# Patient Record
Sex: Male | Born: 1950 | Race: White | Hispanic: No | Smoking: Never smoker
Health system: Southern US, Community
[De-identification: ages and names within clinical notes are randomized; demographics above are authoritative.]

## PROBLEM LIST (undated history)

## (undated) DIAGNOSIS — E7800 Pure hypercholesterolemia, unspecified: Secondary | ICD-10-CM

## (undated) DIAGNOSIS — K921 Melena: Secondary | ICD-10-CM

## (undated) DIAGNOSIS — I10 Essential (primary) hypertension: Secondary | ICD-10-CM

## (undated) DIAGNOSIS — I639 Cerebral infarction, unspecified: Secondary | ICD-10-CM

## (undated) DIAGNOSIS — C61 Malignant neoplasm of prostate: Secondary | ICD-10-CM

## (undated) DIAGNOSIS — Z52008 Unspecified donor, other blood: Secondary | ICD-10-CM

## (undated) DIAGNOSIS — D179 Benign lipomatous neoplasm, unspecified: Secondary | ICD-10-CM

## (undated) HISTORY — DX: Malignant neoplasm of prostate: C61

## (undated) HISTORY — PX: CATARACT EXTRACTION: SUR2

## (undated) HISTORY — PX: OTHER SURGICAL HISTORY: SHX169

## (undated) HISTORY — PX: TONSILLECTOMY: SUR1361

## (undated) HISTORY — PX: HERNIA REPAIR: SHX51

## (undated) HISTORY — PX: CHOLECYSTECTOMY: SHX55

## (undated) HISTORY — DX: Melena: K92.1

## (undated) HISTORY — PX: PROSTATE SURGERY: SHX751

---

## 2000-07-17 ENCOUNTER — Other Ambulatory Visit: Admission: RE | Admit: 2000-07-17 | Discharge: 2000-07-17 | Payer: Self-pay | Admitting: Urology

## 2001-04-05 ENCOUNTER — Encounter: Payer: Self-pay | Admitting: Emergency Medicine

## 2001-04-05 ENCOUNTER — Emergency Department (HOSPITAL_COMMUNITY): Admission: EM | Admit: 2001-04-05 | Discharge: 2001-04-05 | Payer: Self-pay | Admitting: *Deleted

## 2002-10-05 ENCOUNTER — Ambulatory Visit (HOSPITAL_COMMUNITY): Admission: RE | Admit: 2002-10-05 | Discharge: 2002-10-05 | Payer: Self-pay | Admitting: Internal Medicine

## 2002-10-05 HISTORY — PX: COLONOSCOPY: SHX174

## 2003-12-14 ENCOUNTER — Other Ambulatory Visit: Admission: RE | Admit: 2003-12-14 | Discharge: 2003-12-14 | Payer: Self-pay | Admitting: Urology

## 2004-04-09 HISTORY — PX: ROBOT ASSISTED LAPAROSCOPIC RADICAL PROSTATECTOMY: SHX5141

## 2005-03-26 DIAGNOSIS — C61 Malignant neoplasm of prostate: Secondary | ICD-10-CM

## 2005-03-26 HISTORY — DX: Malignant neoplasm of prostate: C61

## 2005-08-09 ENCOUNTER — Ambulatory Visit: Payer: Self-pay | Admitting: Internal Medicine

## 2005-09-24 ENCOUNTER — Ambulatory Visit: Payer: Self-pay | Admitting: Internal Medicine

## 2005-09-24 ENCOUNTER — Ambulatory Visit (HOSPITAL_COMMUNITY): Admission: RE | Admit: 2005-09-24 | Discharge: 2005-09-24 | Payer: Self-pay | Admitting: Internal Medicine

## 2005-09-24 HISTORY — PX: COLONOSCOPY: SHX174

## 2007-11-07 ENCOUNTER — Ambulatory Visit (HOSPITAL_COMMUNITY): Admission: RE | Admit: 2007-11-07 | Discharge: 2007-11-07 | Payer: Self-pay | Admitting: Family Medicine

## 2008-05-10 ENCOUNTER — Ambulatory Visit (HOSPITAL_COMMUNITY): Admission: RE | Admit: 2008-05-10 | Discharge: 2008-05-10 | Payer: Self-pay | Admitting: Urology

## 2009-11-06 ENCOUNTER — Emergency Department (HOSPITAL_COMMUNITY): Admission: EM | Admit: 2009-11-06 | Discharge: 2009-11-06 | Payer: Self-pay | Admitting: Emergency Medicine

## 2009-11-07 ENCOUNTER — Observation Stay (HOSPITAL_COMMUNITY): Admission: EM | Admit: 2009-11-07 | Discharge: 2009-11-08 | Payer: Self-pay | Admitting: Emergency Medicine

## 2009-11-07 ENCOUNTER — Encounter: Payer: Self-pay | Admitting: Emergency Medicine

## 2009-11-08 ENCOUNTER — Encounter (INDEPENDENT_AMBULATORY_CARE_PROVIDER_SITE_OTHER): Payer: Self-pay | Admitting: General Surgery

## 2009-11-08 HISTORY — PX: CHOLECYSTECTOMY: SHX55

## 2010-06-23 LAB — DIFFERENTIAL
Basophils Absolute: 0 10*3/uL (ref 0.0–0.1)
Basophils Absolute: 0 10*3/uL (ref 0.0–0.1)
Basophils Relative: 0 % (ref 0–1)
Basophils Relative: 0 % (ref 0–1)
Eosinophils Relative: 0 % (ref 0–5)
Lymphocytes Relative: 6 % — ABNORMAL LOW (ref 12–46)
Lymphocytes Relative: 8 % — ABNORMAL LOW (ref 12–46)
Monocytes Absolute: 0.8 10*3/uL (ref 0.1–1.0)
Neutro Abs: 12.9 10*3/uL — ABNORMAL HIGH (ref 1.7–7.7)
Neutro Abs: 8.5 10*3/uL — ABNORMAL HIGH (ref 1.7–7.7)
Neutrophils Relative %: 83 % — ABNORMAL HIGH (ref 43–77)

## 2010-06-23 LAB — CBC
HCT: 36.1 % — ABNORMAL LOW (ref 39.0–52.0)
HCT: 42.4 % (ref 39.0–52.0)
Hemoglobin: 14.4 g/dL (ref 13.0–17.0)
MCH: 31 pg (ref 26.0–34.0)
MCHC: 34.5 g/dL (ref 30.0–36.0)
MCV: 91.3 fL (ref 78.0–100.0)
RBC: 4.65 MIL/uL (ref 4.22–5.81)
RDW: 12.6 % (ref 11.5–15.5)
WBC: 10.2 10*3/uL (ref 4.0–10.5)

## 2010-06-23 LAB — BASIC METABOLIC PANEL
BUN: 12 mg/dL (ref 6–23)
Calcium: 8.1 mg/dL — ABNORMAL LOW (ref 8.4–10.5)
GFR calc non Af Amer: >60 mL/min (ref >60)
Glucose, Bld: 93 mg/dL (ref 70–99)
Potassium: 3.8 mEq/L (ref 3.5–5.1)
Sodium: 136 mEq/L (ref 135–145)

## 2010-06-23 LAB — COMPREHENSIVE METABOLIC PANEL
Alkaline Phosphatase: 58 U/L (ref 39–117)
BUN: 13 mg/dL (ref 6–23)
CO2: 28 mEq/L (ref 19–32)
Chloride: 101 mEq/L (ref 96–112)
Creatinine, Ser: 0.96 mg/dL (ref 0.4–1.5)
GFR calc non Af Amer: >60 mL/min (ref >60)
Potassium: 3.7 mEq/L (ref 3.5–5.1)
Total Bilirubin: 1.3 mg/dL — ABNORMAL HIGH (ref 0.3–1.2)

## 2010-06-23 LAB — LIPASE, BLOOD: Lipase: 25 U/L (ref 11–59)

## 2010-06-24 LAB — DIFFERENTIAL
Basophils Absolute: 0 K/uL (ref 0.0–0.1)
Basophils Relative: 0 % (ref 0–1)
Eosinophils Absolute: 0.1 10*3/uL (ref 0.0–0.7)
Eosinophils Relative: 1 % (ref 0–5)
Lymphocytes Relative: 10 % — ABNORMAL LOW (ref 12–46)
Lymphs Abs: 1.4 10*3/uL (ref 0.7–4.0)
Monocytes Absolute: 0.5 K/uL (ref 0.1–1.0)
Monocytes Relative: 4 % (ref 3–12)
Neutro Abs: 12.2 K/uL — ABNORMAL HIGH (ref 1.7–7.7)
Neutrophils Relative %: 86 % — ABNORMAL HIGH (ref 43–77)

## 2010-06-24 LAB — URINALYSIS, ROUTINE W REFLEX MICROSCOPIC
Bilirubin Urine: NEGATIVE
Glucose, UA: NEGATIVE mg/dL
Hgb urine dipstick: NEGATIVE
Ketones, ur: 40 mg/dL — AB
Nitrite: NEGATIVE
Protein, ur: NEGATIVE mg/dL
Specific Gravity, Urine: 1.015 (ref 1.005–1.030)
Urobilinogen, UA: 0.2 mg/dL (ref 0.0–1.0)
pH: 8.5 — ABNORMAL HIGH (ref 5.0–8.0)

## 2010-06-24 LAB — COMPREHENSIVE METABOLIC PANEL
ALT: 17 U/L (ref 0–53)
AST: 20 U/L (ref 0–37)
Alkaline Phosphatase: 63 U/L (ref 39–117)
CO2: 25 mEq/L (ref 19–32)
Calcium: 9.4 mg/dL (ref 8.4–10.5)
Chloride: 103 mEq/L (ref 96–112)
GFR calc Af Amer: >60 mL/min (ref >60)
GFR calc non Af Amer: >60 mL/min (ref >60)
Glucose, Bld: 135 mg/dL — ABNORMAL HIGH (ref 70–99)
Potassium: 3.6 mEq/L (ref 3.5–5.1)
Sodium: 139 mEq/L (ref 135–145)

## 2010-06-24 LAB — CBC
HCT: 43.1 % (ref 39.0–52.0)
Hemoglobin: 14.9 g/dL (ref 13.0–17.0)
MCH: 31.1 pg (ref 26.0–34.0)
MCHC: 34.5 g/dL (ref 30.0–36.0)
MCV: 90.3 fL (ref 78.0–100.0)
Platelets: 268 K/uL (ref 150–400)
RBC: 4.78 MIL/uL (ref 4.22–5.81)
RDW: 12.7 % (ref 11.5–15.5)
WBC: 14.3 10*3/uL — ABNORMAL HIGH (ref 4.0–10.5)

## 2010-06-24 LAB — POCT CARDIAC MARKERS
CKMB, poc: <1 ng/mL — ABNORMAL LOW (ref 1.0–8.0)
Myoglobin, poc: 50 ng/mL (ref 12–200)
Troponin i, poc: <0.05 ng/mL (ref 0.00–0.09)

## 2010-06-24 LAB — COMPREHENSIVE METABOLIC PANEL WITH GFR
Albumin: 4.2 g/dL (ref 3.5–5.2)
BUN: 14 mg/dL (ref 6–23)
Creatinine, Ser: 1.12 mg/dL (ref 0.4–1.5)
Total Bilirubin: 0.6 mg/dL (ref 0.3–1.2)
Total Protein: 6.7 g/dL (ref 6.0–8.3)

## 2010-06-24 LAB — LIPASE, BLOOD: Lipase: 26 U/L (ref 11–59)

## 2010-08-25 NOTE — Op Note (Signed)
NAME:  [name], [name]                ACCOUNT NO.:  [id number]   MEDICAL RECORD NO.:  [id number]          PATIENT TYPE:  AMB   LOCATION:  DAY                           FACILITY:  [facility]   PHYSICIAN:  Lionel December, M.D.    DATE OF BIRTH:  January 03, 1951   DATE OF PROCEDURE:  09/24/2005  DATE OF DISCHARGE:                                 OPERATIVE REPORT   PROCEDURES:  Colonoscopy.   INDICATIONS:  [name] is 60 year old Caucasian male with intermittent  hematochezia which started 6 or 7 months ago.  It is suspected that he has  hemorrhoidal bleeding even though he did not respond to a topical therapy  given to him by Dr. Phillips Odor.  He has history of colonic polyps.  He had two  pedunculated adenomas removed by Dr. Jena Gauss 3 years ago and is due for  surveillance colonoscopy.  Procedure risks were reviewed the patient,  informed consent was obtained.   MEDS FOR CONSCIOUS SEDATION:  Demerol 50 mg IV, Versed 5 mg IV.   FINDINGS:  Procedure performed in endoscopy suite.  The patient's vital  signs and O2 sat were monitored during procedure and remained stable.  The  patient was placed left lateral position.  Rectal examination performed.  No  abnormality noted external or digital exam.  Olympus videoscope was placed  rectum and advanced under vision into sigmoid colon beyond.  Preparation was  excellent.  Few small scattered diverticula noted at sigmoid colon.  Scope  was passed into cecum which was identified by ileocecal valve and  appendiceal orifice.  Pictures taken for the record.  As the scope was  withdrawn colonic mucosa was examined for the second time and there were no  polyps or arteriovenous malformation or masses.  Rectal mucosa and was  normal.  Scope was retroflexed to examine anorectal junction and hemorrhoids  were noted above and below the dentate line.  Endoscope was straightened and  withdrawn.  The patient tolerated the procedure well.   FINAL DIAGNOSIS:  No evidence of  recurrent polyps or colitis.   Few scattered diverticula at sigmoid colon.   External slash internal hemorrhoids felt to be source of his recurrent  hematochezia.   RECOMMENDATIONS:  High-fiber diet.  Citrucel 3-4 grams daily.  Colace 2  tablets at bedtime.   He will keep the record as to frequency of bleeding episodes and call us if  it is occurring on regular basis.   He will return for next colonoscopy in 5 years from now.      Lionel December, M.D.  Electronically Signed     NR/MEDQ  D:  09/24/2005  T:  09/25/2005  Job:  [id number]   cc:   Corrie Mckusick, M.D.  Fax: [phone]

## 2010-08-25 NOTE — Consult Note (Signed)
NAME:  [name], [name]                            ACCOUNT NO.:  [id number]   MEDICAL RECORD NO.:  [id number]                  PATIENT TYPE:   LOCATION:                                       FACILITY:  [facility]   PHYSICIAN:  R. Roetta Sessions, M.D.              DATE OF BIRTH:  [birth year]   DATE OF CONSULTATION:  DATE OF DISCHARGE:                                   CONSULTATION   REFERRING PHYSICIAN:  Corrie Mckusick, M.D.   REASON FOR CONSULTATION:  Rectal bleeding.   HISTORY OF PRESENT ILLNESS:  [name] is a 60 year old Caucasian gentleman  who has had 2-3 episodes of intermittent hematochezia over the past 15  years.  However, a few weeks ago, he had rectal bleeding which occurred  every day for approximately a week; bright red in color and noted only the  toilet tissue.  He has had no pain, no rectal bleeding, abdominal pain.  Bowel movements usually every other day.  He denies any constipation,  nausea, vomiting, or diarrhea.  His weight has been stable.  He has no  history of hemorrhoids.  He had occasional reflux a few years ago.  He  required PPI therapy for approximately a month.  He has had no reflux since.  His appetite has been good.   CURRENT MEDICATIONS:  None.   ALLERGIES:  No known drug allergies.   PAST MEDICAL HISTORY:  Benign prostatic hypertrophy status post surgery.  Tonsillectomy.   FAMILY HISTORY:  Negative for chronic GI illnesses, colorectal cancer.   SOCIAL HISTORY:  Married for 12 years.  He has no children.  He is employed  with [employer] in [employer].  He is also a Visual merchandiser.  He denies any tobacco abuse.  He drinks alcohol approximately once monthly.   REVIEW OF SYSTEMS:  Please see HPI for GI and general.  CARDIOPULMONARY:  Denies any chest pain, shortness of breath, or palpitations.   PHYSICAL EXAMINATION:  VITAL SIGNS:  Weight 215, height 6 feet 2 inches,  temperature 97.2, blood pressure 118/70, pulse 70.  GENERAL:  A  pleasant, well-nourished, well-developed Caucasian male in no  acute distress.  SKIN:  Warm and dry, no jaundice.  HEENT:  Conjunctivae are pink, sclerae nonicteric.  Oropharyngeal mucosa is  moist and pink.  CHEST:  Lungs are clear to auscultation.  CARDIAC:  Reveals regular rate and rhythm, normal S1, S2.  No murmurs, rubs,  or gallops.  ABDOMEN:  Positive bowel sounds.  Soft, nontender, nondistended, no  organomegaly or masses.  EXTREMITIES:  No edema.  RECTAL:  Examination performed by Marijo Conception, nurse practitioner,  no evidence of external hemorrhoids.  Internal exam not performed.   IMPRESSION:  The patient is a 60 year old gentleman with intermittent small-  volume hematochezia.  Suspect that it is due to benign anorectal source.  He  has never had a colonoscopy and  I have recommended one today both for  diagnostic purpose as well as colorectal cancer screening.  I have discussed  risks, alternatives, and benefits with the patient and he is agreeable to  proceed.   PLAN:  Colonoscopy in the near future.  I would like to thank Dr. Phillips Jones  for allowing [place] to take part in the care of this patient.     Tana Coast, Pricilla Larsson, M.D.    LL/MEDQ  D:  09/17/2002  T:  09/17/2002  Job:  [id number]

## 2010-08-25 NOTE — Op Note (Signed)
NAME:  [name], [name]                          ACCOUNT NO.:  [id number]   MEDICAL RECORD NO.:  [id number]                   PATIENT TYPE:  AMB   LOCATION:  DAY                                  FACILITY:  [facility]   PHYSICIAN:  R. Roetta Sessions, M.D.              DATE OF BIRTH:  28-Feb-1951   DATE OF PROCEDURE:  10/05/2002  DATE OF DISCHARGE:                                 OPERATIVE REPORT   PROCEDURE:  Colonoscopy with snare polypectomy.   INDICATIONS FOR PROCEDURE:  The patient is a 61 year old gentleman with a  recent single episode of small volume hematochezia.  Colonoscopy is now  being done to further evaluate hematochezia.  This approach has been  discussed with the patient and potential risks, benefits, and alternatives  have been reviewed, questions answered.  He is agreeable.  Please see my  dictated consultation note for more information.   MONITORING:  O2 saturation, blood pressure, and pulses and respirations were  monitored throughout the entire procedure.   PREMEDICATION:  Conscious sedation Versed 4 mg IV, Demerol 75 mg IV in  divided doses.   INSTRUMENT:  Olympus video chip adult colonoscope.   FINDINGS:  Digital rectal examination revealed no abnormalities.   ENDOSCOPIC FINDINGS:  The prep was good.  The rectum: Examination of the  rectal mucosa including retroflexed view of the anal verge revealed only  some anal canal hemorrhoids.   Colon:  The colonic mucosa was surveyed from the rectosigmoid junction  through the left, transverse and right colon to the area of the appendiceal  orifice, ileocecal valve and cecum.  These structures were well seen and  photographed for the record.  The patient was noted to have scattered  sigmoid diverticula.  The patient had pedunculated polyps in the right  colon.  The cecum, ileocecal valve, appendiceal orifice were well seen and  photographed for the record.  The patient had a 1.25 cm pedunculated polyp  in the mid  ascending colon and two 0.75 cm polyps on stalks in the area of  the hepatic flexure.  These were snared and recovered through the scope  without difficulty.  From this level, the scope was slowly withdrawn.  All  previously mentioned mucosal surfaces were again seen and no other  abnormalities were observed.  The patient tolerated the procedure well, was  reactive to endoscopy.   IMPRESSION:  1. Anal canal hemorrhoids, otherwise normal rectum.  2. Scattered sigmoid diverticula.  3. Pedunculated polyps in the right colon/hepatic flexure resected as     described above.  The remainder of the colonic mucosa appeared normal.   RECOMMENDATIONS:  1. No aspirin, Advil or other aspirin-type medications for 10 days.  2.     Diverticulosis literature provided to [name].  3. Follow up on pathology.  4. Further recommendations to follow.  [name], M.D.    RMR/MEDQ  D:  10/05/2002  T:  10/05/2002  Job:  [id number]   cc:   Corrie Mckusick, M.D.  [address] [place]. [place]  [place]  [place] [address]  Fax: [phone]

## 2010-08-25 NOTE — Consult Note (Signed)
NAME:  [name]                ACCOUNT NO.:  [id number]   MEDICAL RECORD NO.:  [id number]           PATIENT TYPE:   LOCATION:                                 FACILITY:   PHYSICIAN:  R. Roetta Sessions, M.D. DATE OF BIRTH:  [birth year]   DATE OF CONSULTATION:  08/09/2005  DATE OF DISCHARGE:                                   CONSULTATION   REQUESTING PHYSICIAN:  Dr. Assunta Found   REASON FOR CONSULTATION:  Hematochezia.   HISTORY OF PRESENT ILLNESS:  [name] is a 60 year old Caucasian gentleman  patient of Dr. Assunta Found who presents for further evaluation of  hematochezia.  He has a history of adenomatous polyps removed in June 2004.  He had pedunculated polyps in the right colon and hepatic flexure.  He had  scattered sigmoid diverticula, anal canal hemorrhoids.  He is due for a  surveillance study next month but because of intermittent hematochezia for  the last six months or so he has been scheduled to be seen today.  He says  he was diagnosed with prostate cancer in August 2006.  Ever since the time  of his first prostate biopsy he has had intermittent bleeding per rectum.  This went on until he had his prostate surgery.  He did well for a couple  months, but back in February started having intermittent hematochezia again.  Sometimes he describes large volume fresh blood per rectum.  No clots.  Denies any rectal pain, abdominal pain.  Rarely has constipation.  No  heartburn as long as he takes his Aciphex.  No diarrhea or melena.   CURRENT MEDICATIONS:  1.  Aciphex 20 mg daily p.r.n.  2.  Detrol LA 4 mg daily.  3.  Boost one can daily.   ALLERGIES:  No known drug allergies.   PAST MEDICAL HISTORY:  1.  Prostate cancer as outlined above.  2.  Chronic gastroesophageal reflux disease.  3.  Tonsillectomy.   FAMILY HISTORY:  Father had prostate cancer.  Mother has dementia, heart  disease.  Father also has kidney disease.  No family history of colorectal  cancer.   SOCIAL HISTORY:  He is married.  No children.  He works at [employer].  Nonsmoker.  Rarely consumes alcohol.   REVIEW OF SYSTEMS:  See HPI for GI.  CONSTITUTIONAL:  No weight loss.  CARDIOPULMONARY:  No chest pain.  No shortness of breath.   PHYSICAL EXAMINATION:  VITAL SIGNS:  Weight 215, height 6 feet 2 inches,  temperature 98.1, blood pressure 122/80, pulse 74.  GENERAL:  Pleasant, well-nourished, well-developed Caucasian male in no  acute distress.  SKIN:  Warm and dry.  No jaundice.  HEENT:  Conjunctivae are pink.  Sclerae non-icteric.  Oropharyngeal mucosa  moist and pink.  No lesions, erythema, or exudate.  No lymphadenopathy,  thyromegaly.  CHEST:  Lungs are clear to auscultation.  CARDIAC:  Regular rate and rhythm.  Normal S1, S2.  No murmurs, rubs, or  gallops.  ABDOMEN:  Positive bowel sounds, soft, nondistended.  Small umbilical hernia  easily reducible, nontender.  Abdomen is  soft.  No organomegaly or masses.  No rebound tenderness or guarding.  EXTREMITIES:  No edema.  RECTAL:  Deferred until time of colonoscopy.   IMPRESSION:  [name] is a 60 year old gentleman with history of  adenomatous polyps, last colonoscopy June 2004, who presents with  intermittent hematochezia for the last six to eight months' duration.  Previously patient had fairly large polyps measuring 1.25 cm and 0.75 cm on  stalks.  He is due for a surveillance examination anyway and because of  persistent symptoms will go ahead and proceed now.  I have discussed risks,  alternatives, and benefits with patient.  He is agreeable to proceed.   PLAN:  Colonoscopy in the near future.  Further recommendations to follow.      Tana Coast, P.AJonathon Bellows, M.D.  Electronically Signed    LL/MEDQ  D:  08/09/2005  T:  08/10/2005  Job:  [id number]

## 2010-11-17 ENCOUNTER — Telehealth (INDEPENDENT_AMBULATORY_CARE_PROVIDER_SITE_OTHER): Payer: Self-pay | Admitting: *Deleted

## 2010-11-17 NOTE — Telephone Encounter (Signed)
TCS resch'd to 01/24/11 @ 12:15 (11:15), asa 2 days, movi prep instructions mailed

## 2010-11-22 ENCOUNTER — Encounter (INDEPENDENT_AMBULATORY_CARE_PROVIDER_SITE_OTHER): Payer: Self-pay

## 2010-12-04 ENCOUNTER — Encounter (INDEPENDENT_AMBULATORY_CARE_PROVIDER_SITE_OTHER): Payer: Self-pay | Admitting: Internal Medicine

## 2010-12-04 ENCOUNTER — Telehealth (INDEPENDENT_AMBULATORY_CARE_PROVIDER_SITE_OTHER): Payer: Self-pay | Admitting: *Deleted

## 2010-12-04 ENCOUNTER — Ambulatory Visit (INDEPENDENT_AMBULATORY_CARE_PROVIDER_SITE_OTHER): Payer: BC Managed Care – PPO | Admitting: Internal Medicine

## 2010-12-04 VITALS — BP 120/80 | HR 74 | Temp 98.3°F | Ht 74.0 in | Wt 220.0 lb

## 2010-12-04 DIAGNOSIS — Z860100 Personal history of colon polyps, unspecified: Secondary | ICD-10-CM

## 2010-12-04 DIAGNOSIS — K921 Melena: Secondary | ICD-10-CM

## 2010-12-04 DIAGNOSIS — K649 Unspecified hemorrhoids: Secondary | ICD-10-CM

## 2010-12-04 MED ORDER — HYDROCORTISONE ACETATE 25 MG RE SUPP: 25.0000 mg | Freq: Every day | RECTAL | Status: AC

## 2010-12-04 NOTE — Telephone Encounter (Signed)
TCS sch'd 12/08/10 @ 1:05 (12:05), split movi prep instructions, sample given

## 2010-12-04 NOTE — Patient Instructions (Signed)
anusol-HC supp one per rectum daily at bedtime for 12 days. Increase intake of fiber rich foods. Colace(stool softener0 2 tablets daily. Colonoscopy to be scheduled

## 2010-12-05 NOTE — Consult Note (Signed)
NAME:  Nicolas, Jones                ACCOUNT NO.:  [id number]  MEDICAL RECORD NO.:  [id number]  LOCATION:                                 FACILITY:  PHYSICIAN:  Lionel December, M.D.    DATE OF BIRTH:  07-29-1950  DATE OF CONSULTATION: DATE OF DISCHARGE:                                CONSULTATION   PRESENTING COMPLAINT:  Rectal bleeding.  HISTORY OF PRESENT ILLNESS:  [name] is 60 year old Caucasian male who was referred through courtesy of Dr. Sherwood Gambler, for GI evaluation.  He has history of colonic polyps as reviewed under past medical history whose last exam was in June 2007, who has been experiencing hematochezia for the last 6-7 months.  His bowels move every other day and almost always he sees bright red blood with his bowel movements.  It is usually small in amount.  He denies anorectal or abdominal pain.  He also denies constipation or diarrhea.  He has good appetite and has not lost any weight.  He states his work is not necessarily strenuous or heavy physical work.  He denies nausea and vomiting.  He has occasional heartburn with spicy foods.  He does try to walk or exercise some.  His last physical exam was about a year ago where he had blood work through this employed and this was all normal.  CURRENT MEDICATIONS: 1. Fish oil. 2. Omega-3 fatty acid 1 g one daily. 3. Ibuprofen 200 mg one q.6 p.r.n. which he does not take very often.  PAST MEDICAL HISTORY:  He had tonsillectomy as a child.  He had robotic radical prostatectomy for prostate CA in December 2006 at [facility] and remains in remission.  He had laparoscopic cholecystectomy in August of 2011.  He has history of colonic polyps.  His first exam was in June 2004, by Dr. Jena Gauss.  He had sigmoid diverticulosis, external hemorrhoids, and he had two pedunculated adenomas removed from his right colon.  His subsequent exam was in June 2007, by me and he had sigmoid diverticula and external/internal  hemorrhoids.  ALLERGIES:  NK.  FAMILY HISTORY:  Father lived to be 30 and mother died at age 92.  He has two sisters who are in fair to good health.  SOCIAL HISTORY:  He is married, but does not have any children.  He has never smoked cigarettes and he drinks alcohol occasionally.  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  PHYSICAL EXAMINATION:  VITAL SIGNS:  Weight 220 pounds, he is 74 inches tall, pulse 74 per minute, blood pressure 120/80, respirations 18, and temp is 98.3. HEENT:  Conjunctivae is pink.  Sclerae are nonicteric.  Oropharyngeal mucosa is normal.  Dentition in satisfactory condition.  No neck masses or thyromegaly noted.  Carotids are without bruits. CARDIAC:  With regular rhythm.  Normal S1 and S2.  No murmur or gallop noted. LUNGS:  Clear to auscultation. ABDOMEN:  Symmetrical.  He has normal bowel sounds.  He has small umbilical hernia which is completely reducible.  He has multiple subcutaneous nodules both on the left mid and upper as well as on  the right side consistent with lipomas.  No tenderness, organomegaly, or masses noted. RECTAL:  Limited to external inspection and no abnormality noted. EXTREMITIES:  No peripheral edema or clubbing noted.  ASSESSMENT:  [name] is 60 year old Caucasian male Caucasian male with chronic hematochezia without diarrhea and/or constipation.  I suspect he has hemorrhoidal bleeding.  He has a history of colonic adenomas.  He had two removed in 2004, but his last colonoscopy in June 2007, did not reveal any recurrent polyps.  He is due for colonoscopy.  RECOMMENDATIONS: 1. Diagnostic/surveillance colonoscopy is scheduled in near future. 2. In the meantime, I would like him to increase intake of fiber rich     foods. 3. Colace 2 tablets daily at bedtime. 4. Anusol-HC suppository one per rectum at bedtime for 12 days. 5. Further recommendation will depend on colonoscopic  findings.  We appreciate the opportunity to participate in the care of this gentleman.          ______________________________ Lionel December, M.D.     NR/MEDQ  D:  12/04/2010  T:  12/05/2010  Job:  [id number]  cc:   Madelin Rear. Sherwood Gambler, MD Fax: [phone]

## 2010-12-07 NOTE — Progress Notes (Signed)
PRESENTING COMPLAINT: Rectal bleeding.  HISTORY OF PRESENT ILLNESS: [name] is 61 year old Caucasian male who was  referred through courtesy of Dr. Sherwood Gambler, for GI evaluation. He has  history of colonic polyps as reviewed under past medical history whose  last exam was in June 2007, who has been experiencing hematochezia for  the last 6-7 months. His bowels move every other day and almost always  he sees bright red blood with his bowel movements. It is usually small  in amount. He denies anorectal or abdominal pain. He also denies  constipation or diarrhea. He has good appetite and has not lost any  weight. He states his work is not necessarily strenuous or heavy  physical work. He denies nausea and vomiting. He has occasional  heartburn with spicy foods. He does try to walk or exercise some. His  last physical exam was about a year ago where he had blood work through  this employed and this was all normal.  CURRENT MEDICATIONS:  1. Fish oil.  2. Omega-3 fatty acid 1 g one daily.  3. Ibuprofen 200 mg one q.6 p.r.n. which he does not take very often.  PAST MEDICAL HISTORY: He had tonsillectomy as a child. He had robotic  radical prostatectomy for prostate CA in December 2006 at [facility] and remains in remission. He had laparoscopic cholecystectomy in  August of 2011.  He has history of colonic polyps. His first exam was in June 2004, by  Dr. Jena Gauss. He had sigmoid diverticulosis, external hemorrhoids, and he  had two pedunculated adenomas removed from his right colon. His  subsequent exam was in June 2007, by me and he had sigmoid diverticula  and external/internal hemorrhoids.  ALLERGIES: NK.  FAMILY HISTORY: Father lived to be 63 and mother died at age 20. He  has two sisters who are in fair to good health.  SOCIAL HISTORY: He is married, but does not have any children. He has  never smoked cigarettes and he drinks alcohol occasionally. 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  PHYSICAL EXAMINATION: VITAL SIGNS: Weight 220 pounds, he is 74 inches  tall, pulse 74 per minute, blood pressure 120/80, respirations 18, and  temp is 98.3.  HEENT: Conjunctivae is pink. Sclerae are nonicteric. Oropharyngeal  mucosa is normal. Dentition in satisfactory condition. No neck masses  or thyromegaly noted. Carotids are without bruits.  CARDIAC: With regular rhythm. Normal S1 and S2. No murmur or gallop  noted.  LUNGS: Clear to auscultation.  ABDOMEN: Symmetrical. He has normal bowel sounds. He has small  umbilical hernia which is completely reducible. He has multiple  subcutaneous nodules both on the left mid and upper as well as on the  right side consistent with lipomas. No tenderness, organomegaly, or  masses noted.  RECTAL: Limited to external inspection and no abnormality noted.  EXTREMITIES: No peripheral edema or clubbing noted.  ASSESSMENT: [name] is 60 year old Caucasian male with chronic  hematochezia without diarrhea and/or constipation. I suspect he has  hemorrhoidal bleeding. He has a history of colonic adenomas. He had  two removed in 2004, but his last colonoscopy in June 2007, did not  reveal any polyps. He is due for colonoscopy.  RECOMMENDATIONS:  1. Diagnostic/surveillance colonoscopy is scheduled in near future.  2. In the meantime, I would like him to increase intake of fiber rich  foods.  3. Colace 2 tablets daily at bedtime.  4. Anusol-HC suppository one per rectum at bedtime for 12 days.  5. Further recommendation will depend on colonoscopic findings.  We appreciate the opportunity to participate in the care of this  gentleman.

## 2010-12-08 ENCOUNTER — Encounter (HOSPITAL_COMMUNITY)
Admission: RE | Disposition: A | Discharge status: Home / Self Care | Payer: Self-pay | Source: Ambulatory Visit | Attending: Internal Medicine

## 2010-12-08 ENCOUNTER — Other Ambulatory Visit (INDEPENDENT_AMBULATORY_CARE_PROVIDER_SITE_OTHER): Payer: Self-pay | Admitting: Internal Medicine

## 2010-12-08 ENCOUNTER — Ambulatory Visit (HOSPITAL_COMMUNITY)
Admission: RE | Admit: 2010-12-08 | Discharge: 2010-12-08 | Disposition: A | Discharge status: Home / Self Care | Payer: BC Managed Care – PPO | Source: Ambulatory Visit | Attending: Internal Medicine | Admitting: Internal Medicine

## 2010-12-08 DIAGNOSIS — K5732 Diverticulitis of large intestine without perforation or abscess without bleeding: Secondary | ICD-10-CM | POA: Insufficient documentation

## 2010-12-08 DIAGNOSIS — K921 Melena: Secondary | ICD-10-CM | POA: Insufficient documentation

## 2010-12-08 DIAGNOSIS — Z860100 Personal history of colon polyps, unspecified: Secondary | ICD-10-CM | POA: Insufficient documentation

## 2010-12-08 DIAGNOSIS — D126 Benign neoplasm of colon, unspecified: Secondary | ICD-10-CM

## 2010-12-08 DIAGNOSIS — K5730 Diverticulosis of large intestine without perforation or abscess without bleeding: Secondary | ICD-10-CM | POA: Insufficient documentation

## 2010-12-08 DIAGNOSIS — K644 Residual hemorrhoidal skin tags: Secondary | ICD-10-CM | POA: Insufficient documentation

## 2010-12-08 HISTORY — PX: COLONOSCOPY: SHX5424

## 2010-12-08 SURGERY — COLONOSCOPY
Anesthesia: Moderate Sedation

## 2010-12-08 MED ORDER — MEPERIDINE HCL 50 MG/ML IJ SOLN
INTRAMUSCULAR | Status: AC
  Filled 2010-12-08: qty 1

## 2010-12-08 MED ORDER — METRONIDAZOLE 500 MG PO TABS: 500.0000 mg | ORAL_TABLET | Freq: Three times a day (TID) | ORAL | Status: AC

## 2010-12-08 MED ORDER — MIDAZOLAM HCL 5 MG/5ML IJ SOLN
INTRAMUSCULAR | Status: AC
  Filled 2010-12-08: qty 10

## 2010-12-08 MED ORDER — MEPERIDINE HCL 50 MG/ML IJ SOLN
INTRAMUSCULAR | Status: DC | PRN
  Administered 2010-12-08 (×2): 25 mg via INTRAVENOUS

## 2010-12-08 MED ORDER — SODIUM CHLORIDE 0.9 % IV SOLN
INTRAVENOUS | Status: DC
  Administered 2010-12-08: 12:00:00 via INTRAVENOUS

## 2010-12-08 MED ORDER — MIDAZOLAM HCL 5 MG/5ML IJ SOLN
INTRAMUSCULAR | Status: DC | PRN
  Administered 2010-12-08: 2 mg via INTRAVENOUS
  Administered 2010-12-08: 1 mg via INTRAVENOUS
  Administered 2010-12-08: 2 mg via INTRAVENOUS

## 2010-12-08 MED ORDER — CIPROFLOXACIN HCL 500 MG PO TABS: 500.0000 mg | ORAL_TABLET | Freq: Two times a day (BID) | ORAL | Status: AC

## 2010-12-08 NOTE — H&P (Signed)
No changes since patient seen in the office on 12/04/2010.

## 2010-12-08 NOTE — Op Note (Signed)
COLONOSCOPY REPORT  Endoscopist: Dr. Malissa Hippo, MD  Referred By: Dr. Corrie Mckusick, MD  Procedure Date: 12/08/2010  Procedure: Colonoscopy  Indications: History of colonic polyps. Recurrent hematochezia.  Informed Consent: Procedure and risks were reviewed with the patient and informed consent was obtained.  Medications:  Demerol 50 mg IV  Versed 5 mg IV  Description of procedure: After a digital rectal exam was performed, that colonoscope was advanced from the anus through the rectum and colon to the area of the cecum, ileocecal valve and appendiceal orifice. The cecum was deeply intubated. These structures were well-seen and photographed for the record. From the level of the cecum and ileocecal valve, the scope was slowly and cautiously withdrawn. The mucosal surfaces were carefully surveyed utilizing scope tip to flexion to facilitate fold flattening as needed. The scope was pulled down into the rectum where a thorough exam including retroflexion was performed.  Findings:  Prep excellent.  6 mm polyp snared from transverse colon.  moderate number of diverticula at sigmoid colon and few more at descending.  Single diverticulum at sigmoid colon with edema erythema.  3 mm polyp at distal sigmoid colon was coagulated using snare tip.  Prominent external hemorrhoids  Therapeutic/Diagnostic Maneuvers Performed: See above  Complications: None  Cecal Withdrawal Time: 19 minutes  Impression:  Examination performed to cecum.  Left sided diverticulosis with sigmoid diverticulitis  6 mm polyp snared from transverse colon and 3 mm polyp at sigmoid colon was coagulated using snare tip.  External hemorrhoids felt to be source of patient's recurrent hematochezia.  Recommendations:  No aspirin for one week  Cipro 500 mg twice daily by mouth for 10 days  metronidazole 500 mg twice daily by mouth for 10 days.  High fiber diet.  Physician will contact you with biopsy  results.  REHMAN,NAJEEB U 12/08/2010 1:18 PM   CC: Dr. Colette Ribas, MD

## 2010-12-20 ENCOUNTER — Encounter (INDEPENDENT_AMBULATORY_CARE_PROVIDER_SITE_OTHER): Payer: Self-pay | Admitting: Internal Medicine

## 2010-12-20 ENCOUNTER — Encounter (HOSPITAL_COMMUNITY): Payer: Self-pay | Admitting: Internal Medicine

## 2010-12-29 ENCOUNTER — Encounter (INDEPENDENT_AMBULATORY_CARE_PROVIDER_SITE_OTHER): Payer: Self-pay | Admitting: *Deleted

## 2011-01-24 ENCOUNTER — Ambulatory Visit (HOSPITAL_COMMUNITY): Admission: RE | Admit: 2011-01-24 | Payer: Self-pay | Source: Ambulatory Visit | Admitting: Internal Medicine

## 2011-01-24 ENCOUNTER — Encounter (HOSPITAL_COMMUNITY): Admission: RE | Payer: Self-pay | Source: Ambulatory Visit

## 2011-01-24 SURGERY — COLONOSCOPY
Anesthesia: Moderate Sedation

## 2014-09-10 ENCOUNTER — Ambulatory Visit (INDEPENDENT_AMBULATORY_CARE_PROVIDER_SITE_OTHER): Payer: BC Managed Care – PPO | Admitting: Urology

## 2014-09-10 DIAGNOSIS — N393 Stress incontinence (female) (male): Secondary | ICD-10-CM | POA: Diagnosis not present

## 2014-09-10 DIAGNOSIS — N5231 Erectile dysfunction following radical prostatectomy: Secondary | ICD-10-CM | POA: Diagnosis not present

## 2014-09-10 DIAGNOSIS — Z8546 Personal history of malignant neoplasm of prostate: Secondary | ICD-10-CM | POA: Diagnosis not present

## 2015-04-29 ENCOUNTER — Ambulatory Visit: Payer: Self-pay | Admitting: General Surgery

## 2015-04-29 NOTE — H&P (Addendum)
History of Present Illness Ralene Ok MD; 04/29/2015 10:32 AM) Patient words: NP Hernia.  The patient is a 65 year old male who presents with an inguinal hernia. The patient is a 65 year old male who is referred by Dr. Redmond School for evaluation of a right inguinal hernia. Patient states he's noticed a hernia since the middle December. He states this causes him some pain. The patient is fairly active is currently in the process of building a sunroom. Patient states the hernia is reducible. It bothers him intermittently.   Of note the patient has had a previous robotic prostatectomy with a lower midline incision.  Other Problems Yehuda Mao, RMA; 04/29/2015 10:10 AM) Hypercholesterolemia Other disease, cancer, significant illness Prostate Cancer  Past Surgical History Yehuda Mao, RMA; 04/29/2015 10:10 AM) Cataract Surgery Left. Colon Polyp Removal - Colonoscopy Gallbladder Surgery - Laparoscopic Prostate Surgery - Removal Tonsillectomy  Allergies Shirlean Mylar Gwynn, RMA; 04/29/2015 10:11 AM) No Known Drug Allergies01/20/2017  Medication History Shirlean Mylar Gwynn, RMA; 04/29/2015 10:11 AM) Fish Oil (1360MG  Capsule, Oral) Active. Ibuprofen (200MG  Capsule, Oral) Active. Medications Reconciled  Social History Yehuda Mao, RMA; 04/29/2015 10:10 AM) Alcohol use Occasional alcohol use. Caffeine use Carbonated beverages, Coffee. No drug use Tobacco use Never smoker.  Family History Yehuda Mao, RMA; 04/29/2015 10:10 AM) Diabetes Mellitus Sister. Kidney Disease Father.    Review of Systems Shirlean Mylar Gwynn RMA; 04/29/2015 10:10 AM) General Not Present- Appetite Loss, Chills, Fatigue, Fever, Night Sweats, Weight Gain and Weight Loss. Skin Not Present- Change in Wart/Mole, Dryness, Hives, Jaundice, New Lesions, Non-Healing Wounds, Rash and Ulcer. HEENT Present- Wears glasses/contact lenses. Not Present- Earache, Hearing Loss, Hoarseness, Nose Bleed, Oral Ulcers, Ringing  in the Ears, Seasonal Allergies, Sinus Pain, Sore Throat, Visual Disturbances and Yellow Eyes. Respiratory Not Present- Bloody sputum, Chronic Cough, Difficulty Breathing, Snoring and Wheezing. Breast Not Present- Breast Mass, Breast Pain, Nipple Discharge and Skin Changes. Cardiovascular Not Present- Chest Pain, Difficulty Breathing Lying Down, Leg Cramps, Palpitations, Rapid Heart Rate, Shortness of Breath and Swelling of Extremities. Gastrointestinal Not Present- Abdominal Pain, Bloating, Bloody Stool, Change in Bowel Habits, Chronic diarrhea, Constipation, Difficulty Swallowing, Excessive gas, Gets full quickly at meals, Hemorrhoids, Indigestion, Nausea, Rectal Pain and Vomiting. Male Genitourinary Present- Urine Leakage. Not Present- Blood in Urine, Change in Urinary Stream, Frequency, Impotence, Nocturia, Painful Urination and Urgency. Musculoskeletal Not Present- Back Pain, Joint Pain, Joint Stiffness, Muscle Pain, Muscle Weakness and Swelling of Extremities. Neurological Not Present- Decreased Memory, Fainting, Headaches, Numbness, Seizures, Tingling, Tremor, Trouble walking and Weakness. Psychiatric Not Present- Anxiety, Bipolar, Change in Sleep Pattern, Depression, Fearful and Frequent crying. Endocrine Not Present- Cold Intolerance, Excessive Hunger, Hair Changes, Heat Intolerance, Hot flashes and New Diabetes. Hematology Not Present- Easy Bruising, Excessive bleeding, Gland problems, HIV and Persistent Infections.  Vitals (Robin Gwynn RMA; 04/29/2015 10:12 AM) 04/29/2015 10:11 AM Weight: 207 lb Height: 74in Body Surface Area: 2.21 m Body Mass Index: 26.58 kg/m  Temp.: 97.65F  Pulse: 74 (Regular)  BP: 128/80 (Sitting, Left Arm, Standard)       Physical Exam Ralene Ok MD; 04/29/2015 10:33 AM) General Mental Status-Alert. General Appearance-Consistent with stated age. Hydration-Well hydrated. Voice-Normal.  Head and Neck Head-normocephalic,  atraumatic with no lesions or palpable masses. Trachea-midline.  Eye Eyeball - Bilateral-Extraocular movements intact. Sclera/Conjunctiva - Bilateral-No scleral icterus.  Chest and Lung Exam Chest and lung exam reveals -quiet, even and easy respiratory effort with no use of accessory muscles. Inspection Chest Wall - Normal. Back - normal.  Cardiovascular Cardiovascular examination reveals -normal  heart sounds, regular rate and rhythm with no murmurs.  Abdomen Inspection Skin - Scar - no surgical scars. Hernias - Inguinal hernia - Right - Reducible(Likely likely direct). Palpation/Percussion Normal exam - Soft, Non Tender, No Rebound tenderness, No Rigidity (guarding) and No hepatosplenomegaly. Auscultation Normal exam - Bowel sounds normal.  Neurologic Neurologic evaluation reveals -alert and oriented x 3 with no impairment of recent or remote memory. Mental Status-Normal.  Musculoskeletal Normal Exam - Left-Upper Extremity Strength Normal and Lower Extremity Strength Normal. Normal Exam - Right-Upper Extremity Strength Normal, Lower Extremity Weakness.    Assessment & Plan Ralene Ok MD; 04/29/2015 10:33 AM) RIGHT INGUINAL HERNIA (K40.90) Impression: 64 year old male with a likely right direct inguinal hernia.  1. The patient will like to proceed to the operating room for a laparoscopic( TAPP vs. TEP) versus possible open right inguinal hernia repair with mesh 2. All risks and benefits were discussed with the patient to generally include, but not limited to: infection, bleeding, damage to surrounding structures, acute and chronic nerve pain, and recurrence. Alternatives were offered and described. All questions were answered and the patient voiced understanding of the procedure and wishes to proceed at this point with hernia repair.

## 2015-05-19 ENCOUNTER — Encounter (HOSPITAL_COMMUNITY): Payer: Self-pay

## 2015-05-19 ENCOUNTER — Encounter (HOSPITAL_COMMUNITY)
Admission: RE | Admit: 2015-05-19 | Discharge: 2015-05-19 | Disposition: A | Discharge status: Home / Self Care | Payer: BC Managed Care – PPO | Source: Ambulatory Visit | Attending: General Surgery | Admitting: General Surgery

## 2015-05-19 DIAGNOSIS — Z01812 Encounter for preprocedural laboratory examination: Secondary | ICD-10-CM | POA: Insufficient documentation

## 2015-05-19 DIAGNOSIS — K4090 Unilateral inguinal hernia, without obstruction or gangrene, not specified as recurrent: Secondary | ICD-10-CM | POA: Insufficient documentation

## 2015-05-19 HISTORY — DX: Benign lipomatous neoplasm, unspecified: D17.9

## 2015-05-19 HISTORY — DX: Unspecified donor, other blood: Z52.008

## 2015-05-19 LAB — CBC
HCT: 46.4 % (ref 39.0–52.0)
Hemoglobin: 15.2 g/dL (ref 13.0–17.0)
MCH: 30.2 pg (ref 26.0–34.0)
MCHC: 32.8 g/dL (ref 30.0–36.0)
MCV: 92.1 fL (ref 78.0–100.0)
PLATELETS: 213 10*3/uL (ref 150–400)
RBC: 5.04 MIL/uL (ref 4.22–5.81)
RDW: 12.5 % (ref 11.5–15.5)
WBC: 8.9 10*3/uL (ref 4.0–10.5)

## 2015-05-19 NOTE — Patient Instructions (Addendum)
[name]  05/19/2015   Your procedure is scheduled on: 05-25-15  Report to [facility] Main  Entrance take [facility]  elevators to 3rd floor to  [place] at 0830 AM.  Call this number if you have problems the morning of surgery [phone]   Remember: ONLY 1 PERSON MAY GO WITH YOU TO SHORT STAY TO GET  READY MORNING OF [name].  Do not eat food or drink liquids :After Midnight.     Take these medicines the morning of surgery with A SIP OF WATER: NONE. DO NOT TAKE ANY DIABETIC MEDICATIONS DAY OF YOUR SURGERY                               You may not have any metal on your body including hair pins and              piercings  Do not wear jewelry, make-up, lotions, powders or perfumes, deodorant             Do not wear nail polish.  Do not shave  48 hours prior to surgery.              Men may shave face and neck.   Do not bring valuables to the hospital. [name].  Contacts, dentures or bridgework may not be worn into surgery.  Leave suitcase in the car. After surgery it may be brought to your room.     Patients discharged the day of surgery will not be allowed to drive home.  Name and phone number of your driver: spouse [name] [phone] home  Special Instructions: N/A              Please read over the following fact sheets you were given: _____________________________________________________________________             [facility] - Preparing for Surgery Before surgery, you can play an important role.  Because skin is not sterile, your skin needs to be as free of germs as possible.  You can reduce the number of germs on your skin by washing with CHG (chlorahexidine gluconate) soap before surgery.  CHG is an antiseptic cleaner which kills germs and bonds with the skin to continue killing germs even after washing. Please DO NOT use if you have an allergy to CHG or antibacterial soaps.  If your  skin becomes reddened/irritated stop using the CHG and inform your nurse when you arrive at Short Stay. Do not shave (including legs and underarms) for at least 48 hours prior to the first CHG shower.  You may shave your face/neck. Please follow these instructions carefully:  1.  Shower with CHG Soap the night before surgery and the  morning of Surgery.  2.  If you choose to wash your hair, wash your hair first as usual with your  normal  shampoo.  3.  After you shampoo, rinse your hair and body thoroughly to remove the  shampoo.                           4.  Use CHG as you would any other liquid soap.  You can apply chg directly  to the skin  and wash                       Gently with a scrungie or clean washcloth.  5.  Apply the CHG Soap to your body ONLY FROM THE NECK DOWN.   Do not use on face/ open                           Wound or open sores. Avoid contact with eyes, ears mouth and genitals (private parts).                       Wash face,  Genitals (private parts) with your normal soap.             6.  Wash thoroughly, paying special attention to the area where your surgery  will be performed.  7.  Thoroughly rinse your body with warm water from the neck down.  8.  DO NOT shower/wash with your normal soap after using and rinsing off  the CHG Soap.                9.  Pat yourself dry with a clean towel.            10.  Wear clean pajamas.            11.  Place clean sheets on your bed the night of your first shower and do not  sleep with pets. Day of Surgery : Do not apply any lotions/deodorants the morning of surgery.  Please wear clean clothes to the hospital/surgery center.  FAILURE TO FOLLOW THESE INSTRUCTIONS MAY RESULT IN THE CANCELLATION OF YOUR SURGERY PATIENT SIGNATURE_________________________________  NURSE SIGNATURE__________________________________  ________________________________________________________________________

## 2015-05-25 ENCOUNTER — Ambulatory Visit (HOSPITAL_COMMUNITY)
Admission: RE | Admit: 2015-05-25 | Discharge: 2015-05-25 | Disposition: A | Discharge status: Home / Self Care | Payer: BC Managed Care – PPO | Source: Ambulatory Visit | Attending: General Surgery | Admitting: General Surgery

## 2015-05-25 ENCOUNTER — Encounter (HOSPITAL_COMMUNITY)
Admission: RE | Disposition: A | Discharge status: Home / Self Care | Payer: Self-pay | Source: Ambulatory Visit | Attending: General Surgery

## 2015-05-25 ENCOUNTER — Encounter (HOSPITAL_COMMUNITY): Payer: Self-pay | Admitting: *Deleted

## 2015-05-25 ENCOUNTER — Ambulatory Visit (HOSPITAL_COMMUNITY): Payer: BC Managed Care – PPO | Admitting: Certified Registered Nurse Anesthetist

## 2015-05-25 DIAGNOSIS — Z8546 Personal history of malignant neoplasm of prostate: Secondary | ICD-10-CM | POA: Diagnosis not present

## 2015-05-25 DIAGNOSIS — Z9079 Acquired absence of other genital organ(s): Secondary | ICD-10-CM | POA: Diagnosis not present

## 2015-05-25 DIAGNOSIS — E7800 Pure hypercholesterolemia, unspecified: Secondary | ICD-10-CM | POA: Diagnosis not present

## 2015-05-25 DIAGNOSIS — K4090 Unilateral inguinal hernia, without obstruction or gangrene, not specified as recurrent: Secondary | ICD-10-CM | POA: Insufficient documentation

## 2015-05-25 DIAGNOSIS — Z79899 Other long term (current) drug therapy: Secondary | ICD-10-CM | POA: Diagnosis not present

## 2015-05-25 HISTORY — PX: INSERTION OF MESH: SHX5868

## 2015-05-25 HISTORY — PX: INGUINAL HERNIA REPAIR: SHX194

## 2015-05-25 SURGERY — REPAIR, HERNIA, INGUINAL, LAPAROSCOPIC
Anesthesia: General | Laterality: Right

## 2015-05-25 MED ORDER — CHLORHEXIDINE GLUCONATE 4 % EX LIQD: 1.0000 "application " | Freq: Once | CUTANEOUS | Status: DC

## 2015-05-25 MED ORDER — BUPIVACAINE-EPINEPHRINE (PF) 0.25% -1:200000 IJ SOLN
INTRAMUSCULAR | Status: AC
  Filled 2015-05-25: qty 30

## 2015-05-25 MED ORDER — SUGAMMADEX SODIUM 200 MG/2ML IV SOLN
INTRAVENOUS | Status: AC
  Filled 2015-05-25: qty 2

## 2015-05-25 MED ORDER — ONDANSETRON HCL 4 MG/2ML IJ SOLN
INTRAMUSCULAR | Status: DC | PRN
  Administered 2015-05-25: 4 mg via INTRAVENOUS

## 2015-05-25 MED ORDER — PHENYLEPHRINE 40 MCG/ML (10ML) SYRINGE FOR IV PUSH (FOR BLOOD PRESSURE SUPPORT)
PREFILLED_SYRINGE | INTRAVENOUS | Status: AC
  Filled 2015-05-25: qty 10

## 2015-05-25 MED ORDER — SUGAMMADEX SODIUM 200 MG/2ML IV SOLN
INTRAVENOUS | Status: DC | PRN
  Administered 2015-05-25: 200 mg via INTRAVENOUS

## 2015-05-25 MED ORDER — KETOROLAC TROMETHAMINE 30 MG/ML IJ SOLN
INTRAMUSCULAR | Status: DC | PRN
  Administered 2015-05-25: 30 mg via INTRAVENOUS

## 2015-05-25 MED ORDER — MIDAZOLAM HCL 5 MG/5ML IJ SOLN
INTRAMUSCULAR | Status: DC | PRN
  Administered 2015-05-25: 2 mg via INTRAVENOUS

## 2015-05-25 MED ORDER — FENTANYL CITRATE (PF) 250 MCG/5ML IJ SOLN
INTRAMUSCULAR | Status: DC | PRN
  Administered 2015-05-25: 150 ug via INTRAVENOUS

## 2015-05-25 MED ORDER — LACTATED RINGERS IV SOLN
INTRAVENOUS | Status: DC
  Administered 2015-05-25: 1000 mL via INTRAVENOUS

## 2015-05-25 MED ORDER — ACETAMINOPHEN 325 MG PO TABS: 650.0000 mg | ORAL_TABLET | ORAL | Status: DC | PRN

## 2015-05-25 MED ORDER — ROCURONIUM BROMIDE 100 MG/10ML IV SOLN
INTRAVENOUS | Status: AC
  Filled 2015-05-25: qty 1

## 2015-05-25 MED ORDER — ACETAMINOPHEN 650 MG RE SUPP
650.0000 mg | RECTAL | Status: DC | PRN
  Filled 2015-05-25: qty 1

## 2015-05-25 MED ORDER — CEFAZOLIN SODIUM-DEXTROSE 2-3 GM-% IV SOLR
INTRAVENOUS | Status: AC
  Filled 2015-05-25: qty 50

## 2015-05-25 MED ORDER — ONDANSETRON HCL 4 MG/2ML IJ SOLN
INTRAMUSCULAR | Status: AC
  Filled 2015-05-25: qty 2

## 2015-05-25 MED ORDER — PROPOFOL 10 MG/ML IV BOLUS
INTRAVENOUS | Status: DC | PRN
  Administered 2015-05-25: 150 mg via INTRAVENOUS

## 2015-05-25 MED ORDER — LACTATED RINGERS IV SOLN
INTRAVENOUS | Status: DC | PRN
  Administered 2015-05-25 (×2): via INTRAVENOUS

## 2015-05-25 MED ORDER — PROPOFOL 10 MG/ML IV BOLUS
INTRAVENOUS | Status: AC
  Filled 2015-05-25: qty 20

## 2015-05-25 MED ORDER — DEXAMETHASONE SODIUM PHOSPHATE 10 MG/ML IJ SOLN
INTRAMUSCULAR | Status: DC | PRN
Start: 2015-05-25 — End: 2015-05-25
  Administered 2015-05-25: 10 mg via INTRAVENOUS

## 2015-05-25 MED ORDER — HYDROMORPHONE HCL 1 MG/ML IJ SOLN: 0.2500 mg | INTRAMUSCULAR | Status: DC | PRN

## 2015-05-25 MED ORDER — LIDOCAINE HCL (CARDIAC) 20 MG/ML IV SOLN
INTRAVENOUS | Status: AC
  Filled 2015-05-25: qty 5

## 2015-05-25 MED ORDER — OXYCODONE-ACETAMINOPHEN 5-325 MG PO TABS: 1.0000 | ORAL_TABLET | ORAL | Status: DC | PRN

## 2015-05-25 MED ORDER — SODIUM CHLORIDE 0.9% FLUSH: 3.0000 mL | INTRAVENOUS | Status: DC | PRN

## 2015-05-25 MED ORDER — LIDOCAINE HCL (CARDIAC) 20 MG/ML IV SOLN
INTRAVENOUS | Status: DC | PRN
  Administered 2015-05-25: 50 mg via INTRAVENOUS

## 2015-05-25 MED ORDER — LACTATED RINGERS IV SOLN: INTRAVENOUS | Status: DC

## 2015-05-25 MED ORDER — HYDROMORPHONE HCL 1 MG/ML IJ SOLN
INTRAMUSCULAR | Status: DC | PRN
  Administered 2015-05-25: 2 mg via INTRAVENOUS

## 2015-05-25 MED ORDER — CEFAZOLIN SODIUM-DEXTROSE 2-3 GM-% IV SOLR
2.0000 g | INTRAVENOUS | Status: AC
Start: 2015-05-25 — End: 2015-05-25
  Administered 2015-05-25: 2 g via INTRAVENOUS

## 2015-05-25 MED ORDER — FENTANYL CITRATE (PF) 250 MCG/5ML IJ SOLN
INTRAMUSCULAR | Status: AC
  Filled 2015-05-25: qty 5

## 2015-05-25 MED ORDER — KETOROLAC TROMETHAMINE 30 MG/ML IJ SOLN
INTRAMUSCULAR | Status: AC
  Filled 2015-05-25: qty 1

## 2015-05-25 MED ORDER — HYDROMORPHONE HCL 2 MG/ML IJ SOLN
INTRAMUSCULAR | Status: AC
Start: 2015-05-25 — End: 2015-05-25
  Filled 2015-05-25: qty 1

## 2015-05-25 MED ORDER — BUPIVACAINE-EPINEPHRINE 0.25% -1:200000 IJ SOLN
INTRAMUSCULAR | Status: DC | PRN
  Administered 2015-05-25: 7 mL

## 2015-05-25 MED ORDER — SODIUM CHLORIDE 0.9 % IV SOLN: 250.0000 mL | INTRAVENOUS | Status: DC | PRN

## 2015-05-25 MED ORDER — ROCURONIUM BROMIDE 100 MG/10ML IV SOLN
INTRAVENOUS | Status: DC | PRN
  Administered 2015-05-25: 50 mg via INTRAVENOUS

## 2015-05-25 MED ORDER — 0.9 % SODIUM CHLORIDE (POUR BTL) OPTIME
TOPICAL | Status: DC | PRN
  Administered 2015-05-25: 1000 mL

## 2015-05-25 MED ORDER — OXYCODONE HCL 5 MG PO TABS: 5.0000 mg | ORAL_TABLET | ORAL | Status: DC | PRN

## 2015-05-25 MED ORDER — DEXAMETHASONE SODIUM PHOSPHATE 10 MG/ML IJ SOLN
INTRAMUSCULAR | Status: AC
  Filled 2015-05-25: qty 1

## 2015-05-25 MED ORDER — MIDAZOLAM HCL 2 MG/2ML IJ SOLN
INTRAMUSCULAR | Status: AC
  Filled 2015-05-25: qty 2

## 2015-05-25 MED ORDER — SODIUM CHLORIDE 0.9% FLUSH: 3.0000 mL | Freq: Two times a day (BID) | INTRAVENOUS | Status: DC

## 2015-05-25 MED ORDER — MORPHINE SULFATE (PF) 10 MG/ML IV SOLN: 2.0000 mg | INTRAVENOUS | Status: DC | PRN

## 2015-05-25 MED FILL — OXYCODONE/APAP 5/325MG: 5-325 | 2 days supply | Qty: 30 | Fill #0

## 2015-05-25 SURGICAL SUPPLY — 37 items
APPLIER CLIP 5 13 M/L LIGAMAX5 (MISCELLANEOUS) ×3
BAG URINE DRAINAGE (UROLOGICAL SUPPLIES) ×3 IMPLANT
BENZOIN TINCTURE PRP APPL 2/3 (GAUZE/BANDAGES/DRESSINGS) ×3 IMPLANT
CABLE HIGH FREQUENCY MONO STRZ (ELECTRODE) IMPLANT
CATH FOLEY 3WAY 30CC 16FR (CATHETERS) ×3 IMPLANT
CHLORAPREP W/TINT 26ML (MISCELLANEOUS) ×3 IMPLANT
CLIP APPLIE 5 13 M/L LIGAMAX5 (MISCELLANEOUS) ×1 IMPLANT
CLOSURE WOUND 1/2 X4 (GAUZE/BANDAGES/DRESSINGS) ×1
COVER SURGICAL LIGHT HANDLE (MISCELLANEOUS) ×3 IMPLANT
DECANTER SPIKE VIAL GLASS SM (MISCELLANEOUS) ×3 IMPLANT
ELECT REM PT RETURN 9FT ADLT (ELECTROSURGICAL) ×3
ELECTRODE REM PT RTRN 9FT ADLT (ELECTROSURGICAL) ×1 IMPLANT
ENDOLOOP SUT PDS II  0 18 (SUTURE) ×2
ENDOLOOP SUT PDS II 0 18 (SUTURE) ×1 IMPLANT
GAUZE SPONGE 2X2 8PLY STRL LF (GAUZE/BANDAGES/DRESSINGS) ×1 IMPLANT
GLOVE BIO SURGEON STRL SZ7.5 (GLOVE) ×3 IMPLANT
GOWN STRL REUS W/TWL XL LVL3 (GOWN DISPOSABLE) ×6 IMPLANT
KIT BASIN OR (CUSTOM PROCEDURE TRAY) ×3 IMPLANT
MESH 3DMAX 4X6 RT LRG (Mesh General) ×3 IMPLANT
NEEDLE INSUFFLATION 14GA 120MM (NEEDLE) IMPLANT
PLUG CATH AND CAP STER (CATHETERS) ×3 IMPLANT
RELOAD STAPLE HERNIA 4.0 BLUE (INSTRUMENTS) ×3 IMPLANT
RELOAD STAPLE HERNIA 4.8 BLK (STAPLE) IMPLANT
SCISSORS LAP 5X35 DISP (ENDOMECHANICALS) IMPLANT
SET IRRIG TUBING LAPAROSCOPIC (IRRIGATION / IRRIGATOR) IMPLANT
SET IRRIG Y TYPE TUR BLADDER L (SET/KITS/TRAYS/PACK) ×3 IMPLANT
SPONGE GAUZE 2X2 STER 10/PKG (GAUZE/BANDAGES/DRESSINGS) ×2
STAPLER HERNIA 12 8.5 360D (INSTRUMENTS) ×3 IMPLANT
STRIP CLOSURE SKIN 1/2X4 (GAUZE/BANDAGES/DRESSINGS) ×2 IMPLANT
SUT MNCRL AB 4-0 PS2 18 (SUTURE) ×3 IMPLANT
TOWEL OR 17X26 10 PK STRL BLUE (TOWEL DISPOSABLE) ×3 IMPLANT
TOWEL OR NON WOVEN STRL DISP B (DISPOSABLE) ×3 IMPLANT
TRAY FOLEY W/METER SILVER 14FR (SET/KITS/TRAYS/PACK) ×3 IMPLANT
TRAY FOLEY W/METER SILVER 16FR (SET/KITS/TRAYS/PACK) ×3 IMPLANT
TRAY LAPAROSCOPIC (CUSTOM PROCEDURE TRAY) ×3 IMPLANT
TROCAR CANNULA W/PORT DUAL 5MM (MISCELLANEOUS) ×3 IMPLANT
TROCAR XCEL 12X100 BLDLESS (ENDOMECHANICALS) ×3 IMPLANT

## 2015-05-25 NOTE — Interval H&P Note (Signed)
History and Physical Interval Note:  05/25/2015 8:20 AM  [name]  has presented today for surgery, with the diagnosis of Right inguinal hernia  The various methods of treatment have been discussed with the patient and family. After consideration of risks, benefits and other options for treatment, the patient has consented to  Procedure(s): LAPAROSCOPIC, POSSIBLE OPEN RIGHT INGUINAL HERNIA WITH MESH (Right) INSERTION OF MESH (Right) as a surgical intervention .  The patient's history has been reviewed, patient examined, no change in status, stable for surgery.  I have reviewed the patient's chart and labs.  Questions were answered to the patient's satisfaction.     Rosario Jacks., Anne Hahn

## 2015-05-25 NOTE — Anesthesia Postprocedure Evaluation (Signed)
Anesthesia Post Note  Patient: [name]  Procedure(s) Performed: Procedure(s) (LRB): LAPAROSCOPIC, POSSIBLE OPEN RIGHT INGUINAL HERNIA WITH MESH (Right) INSERTION OF MESH (Right)  Patient location during evaluation: PACU Anesthesia Type: General Level of consciousness: awake and alert Pain management: pain level controlled Vital Signs Assessment: post-procedure vital signs reviewed and stable Respiratory status: spontaneous breathing, nonlabored ventilation, respiratory function stable and patient connected to nasal cannula oxygen Cardiovascular status: blood pressure returned to baseline and stable Postop Assessment: no signs of nausea or vomiting Anesthetic complications: no    Last Vitals:  Filed Vitals:   05/25/15 1245 05/25/15 1255  BP: 133/80 130/70  Pulse: 82 62  Temp:  36.2 C  Resp: 18 16    Last Pain:  Filed Vitals:   05/25/15 1255  PainSc: 0-No pain                 [place] Ehl[place] L

## 2015-05-25 NOTE — H&P (View-Only) (Signed)
History of Present Illness Ralene Ok MD; 04/29/2015 10:32 AM) Patient words: NP Hernia.  The patient is a 65 year old male who presents with an inguinal hernia. The patient is a 65 year old male who is referred by Dr. Redmond School for evaluation of a right inguinal hernia. Patient states he's noticed a hernia since the middle December. He states this causes him some pain. The patient is fairly active is currently in the process of building a sunroom. Patient states the hernia is reducible. It bothers him intermittently.   Of note the patient has had a previous robotic prostatectomy with a lower midline incision.  Other Problems Yehuda Mao, RMA; 04/29/2015 10:10 AM) Hypercholesterolemia Other disease, cancer, significant illness Prostate Cancer  Past Surgical History Yehuda Mao, RMA; 04/29/2015 10:10 AM) Cataract Surgery Left. Colon Polyp Removal - Colonoscopy Gallbladder Surgery - Laparoscopic Prostate Surgery - Removal Tonsillectomy  Allergies Shirlean Mylar Gwynn, RMA; 04/29/2015 10:11 AM) No Known Drug Allergies01/20/2017  Medication History Shirlean Mylar Gwynn, RMA; 04/29/2015 10:11 AM) Fish Oil (1360MG  Capsule, Oral) Active. Ibuprofen (200MG  Capsule, Oral) Active. Medications Reconciled  Social History Yehuda Mao, RMA; 04/29/2015 10:10 AM) Alcohol use Occasional alcohol use. Caffeine use Carbonated beverages, Coffee. No drug use Tobacco use Never smoker.  Family History Yehuda Mao, RMA; 04/29/2015 10:10 AM) Diabetes Mellitus Sister. Kidney Disease Father.    Review of Systems Shirlean Mylar Gwynn RMA; 04/29/2015 10:10 AM) General Not Present- Appetite Loss, Chills, Fatigue, Fever, Night Sweats, Weight Gain and Weight Loss. Skin Not Present- Change in Wart/Mole, Dryness, Hives, Jaundice, New Lesions, Non-Healing Wounds, Rash and Ulcer. HEENT Present- Wears glasses/contact lenses. Not Present- Earache, Hearing Loss, Hoarseness, Nose Bleed, Oral Ulcers, Ringing  in the Ears, Seasonal Allergies, Sinus Pain, Sore Throat, Visual Disturbances and Yellow Eyes. Respiratory Not Present- Bloody sputum, Chronic Cough, Difficulty Breathing, Snoring and Wheezing. Breast Not Present- Breast Mass, Breast Pain, Nipple Discharge and Skin Changes. Cardiovascular Not Present- Chest Pain, Difficulty Breathing Lying Down, Leg Cramps, Palpitations, Rapid Heart Rate, Shortness of Breath and Swelling of Extremities. Gastrointestinal Not Present- Abdominal Pain, Bloating, Bloody Stool, Change in Bowel Habits, Chronic diarrhea, Constipation, Difficulty Swallowing, Excessive gas, Gets full quickly at meals, Hemorrhoids, Indigestion, Nausea, Rectal Pain and Vomiting. Male Genitourinary Present- Urine Leakage. Not Present- Blood in Urine, Change in Urinary Stream, Frequency, Impotence, Nocturia, Painful Urination and Urgency. Musculoskeletal Not Present- Back Pain, Joint Pain, Joint Stiffness, Muscle Pain, Muscle Weakness and Swelling of Extremities. Neurological Not Present- Decreased Memory, Fainting, Headaches, Numbness, Seizures, Tingling, Tremor, Trouble walking and Weakness. Psychiatric Not Present- Anxiety, Bipolar, Change in Sleep Pattern, Depression, Fearful and Frequent crying. Endocrine Not Present- Cold Intolerance, Excessive Hunger, Hair Changes, Heat Intolerance, Hot flashes and New Diabetes. Hematology Not Present- Easy Bruising, Excessive bleeding, Gland problems, HIV and Persistent Infections.  Vitals (Robin Gwynn RMA; 04/29/2015 10:12 AM) 04/29/2015 10:11 AM Weight: 207 lb Height: 74in Body Surface Area: 2.21 m Body Mass Index: 26.58 kg/m  Temp.: 97.47F  Pulse: 74 (Regular)  BP: 128/80 (Sitting, Left Arm, Standard)       Physical Exam Ralene Ok MD; 04/29/2015 10:33 AM) General Mental Status-Alert. General Appearance-Consistent with stated age. Hydration-Well hydrated. Voice-Normal.  Head and Neck Head-normocephalic,  atraumatic with no lesions or palpable masses. Trachea-midline.  Eye Eyeball - Bilateral-Extraocular movements intact. Sclera/Conjunctiva - Bilateral-No scleral icterus.  Chest and Lung Exam Chest and lung exam reveals -quiet, even and easy respiratory effort with no use of accessory muscles. Inspection Chest Wall - Normal. Back - normal.  Cardiovascular Cardiovascular examination reveals -normal  heart sounds, regular rate and rhythm with no murmurs.  Abdomen Inspection Skin - Scar - no surgical scars. Hernias - Inguinal hernia - Right - Reducible(Likely likely direct). Palpation/Percussion Normal exam - Soft, Non Tender, No Rebound tenderness, No Rigidity (guarding) and No hepatosplenomegaly. Auscultation Normal exam - Bowel sounds normal.  Neurologic Neurologic evaluation reveals -alert and oriented x 3 with no impairment of recent or remote memory. Mental Status-Normal.  Musculoskeletal Normal Exam - Left-Upper Extremity Strength Normal and Lower Extremity Strength Normal. Normal Exam - Right-Upper Extremity Strength Normal, Lower Extremity Weakness.    Assessment & Plan Ralene Ok MD; 04/29/2015 10:33 AM) RIGHT INGUINAL HERNIA (K40.90) Impression: 65 year old male with a likely right direct inguinal hernia.  1. The patient will like to proceed to the operating room for a laparoscopic( TAPP vs. TEP) versus possible open right inguinal hernia repair with mesh 2. All risks and benefits were discussed with the patient to generally include, but not limited to: infection, bleeding, damage to surrounding structures, acute and chronic nerve pain, and recurrence. Alternatives were offered and described. All questions were answered and the patient voiced understanding of the procedure and wishes to proceed at this point with hernia repair.

## 2015-05-25 NOTE — Progress Notes (Signed)
Pt up in room and ambulated down hallway with standby assist.  He tolerated well and is without complaints.

## 2015-05-25 NOTE — Anesthesia Procedure Notes (Signed)
Procedure Name: Intubation Date/Time: 05/25/2015 10:56 AM Performed by: Chandra Batch A Pre-anesthesia Checklist: Patient identified, Emergency Drugs available, Suction available, Patient being monitored and Timeout performed Patient Re-evaluated:Patient Re-evaluated prior to inductionOxygen Delivery Method: Circle system utilized Preoxygenation: Pre-oxygenation with 100% oxygen Intubation Type: IV induction Ventilation: Mask ventilation without difficulty Laryngoscope Size: Mac and 4 Grade View: Grade II Tube type: Oral Number of attempts: 1 Airway Equipment and Method: Stylet Placement Confirmation: ETT inserted through vocal cords under direct vision,  positive ETCO2 and breath sounds checked- equal and bilateral Secured at: 23 cm Tube secured with: Tape Dental Injury: Teeth and Oropharynx as per pre-operative assessment

## 2015-05-25 NOTE — Discharge Instructions (Signed)
[facility] _______[facility] ° °INGUINAL HERNIA REPAIR: POST OP INSTRUCTIONS ° °Always review your discharge instruction sheet given to you by the facility where your surgery was performed. °IF YOU HAVE DISABILITY OR FAMILY LEAVE FORMS, YOU MUST BRING THEM TO THE OFFICE FOR PROCESSING.   °DO NOT GIVE THEM TO YOUR DOCTOR. ° °1. A  prescription for pain medication may be given to you upon discharge.  Take your pain medication as prescribed, if needed.  If narcotic pain medicine is not needed, then you may take acetaminophen (Tylenol) or ibuprofen (Advil) as needed. °2. Take your usually prescribed medications unless otherwise directed. °3. If you need a refill on your pain medication, please contact your pharmacy.  They will contact our office to request authorization. Prescriptions will not be filled after 5 pm or on week-ends. °4. You should follow a light diet the first 24 hours after arrival home, such as soup and crackers, etc.  Be sure to include lots of fluids daily.  Resume your normal diet the day after surgery. °5. Most patients will experience some swelling and bruising around the umbilicus or in the groin and scrotum.  Ice packs and reclining will help.  Swelling and bruising can take several days to resolve.  °6. It is common to experience some constipation if taking pain medication after surgery.  Increasing fluid intake and taking a stool softener (such as Colace) will usually help or prevent this problem from occurring.  A mild laxative (Milk of Magnesia or Miralax) should be taken according to package directions if there are no bowel movements after 48 hours. °7. Unless discharge instructions indicate otherwise, you may remove your bandages 24-48 hours after surgery, and you may shower at that time.  You may have steri-strips (small skin tapes) in place directly over the incision.  These strips should be left on the skin for 7-10 days.  If your surgeon used skin glue on the incision, you  may shower in 24 hours.  The glue will flake off over the next 2-3 weeks.  Any sutures or staples will be removed at the office during your follow-up visit. °8. ACTIVITIES:  You may resume regular (light) daily activities beginning the next day--such as daily self-care, walking, climbing stairs--gradually increasing activities as tolerated.  You may have sexual intercourse when it is comfortable.  Refrain from any heavy lifting or straining until approved by your doctor. °a. You may drive when you are no longer taking prescription pain medication, you can comfortably wear a seatbelt, and you can safely maneuver your car and apply brakes. °b. RETURN TO WORK:  __________________________________________________________ °9. You should see your doctor in the office for a follow-up appointment approximately 2-3 weeks after your surgery.  Make sure that you call for this appointment within a day or two after you arrive home to insure a convenient appointment time. °10. OTHER INSTRUCTIONS:  __________________________________________________________________________________________________________________________________________________________________________________________  °WHEN TO CALL YOUR DOCTOR: °1. Fever over 101.0 °2. Inability to urinate °3. Nausea and/or vomiting °4. Extreme swelling or bruising °5. Continued bleeding from incision. °6. Increased pain, redness, or drainage from the incision ° °The clinic staff is available to answer your questions during regular business hours.  Please don’t hesitate to call and ask to speak to one of the nurses for clinical concerns.  If you have a medical emergency, go to the nearest emergency room or call 911.  A surgeon from [facility] is always on call at the hospital ° ° °[address], [address], [place], [place]  [address] ? ° [address] [address], [place], [place]   [address] °[phone] ? [phone] ? FAX [phone] °Web site:  [email or web address] ° °

## 2015-05-25 NOTE — Anesthesia Preprocedure Evaluation (Signed)
Anesthesia Evaluation  Patient identified by MRN, date of birth, ID band Patient awake    Reviewed: Allergy & Precautions, H&P , NPO status , Patient's Chart, lab work & pertinent test results  Airway Mallampati: II  TM Distance: >3 FB Neck ROM: full    Dental no notable dental hx. (+) Dental Advisory Given, Teeth Intact   Pulmonary neg pulmonary ROS,    Pulmonary exam normal breath sounds clear to auscultation       Cardiovascular Exercise Tolerance: Good negative cardio ROS Normal cardiovascular exam Rhythm:regular Rate:Normal     Neuro/Psych negative neurological ROS  negative psych ROS   GI/Hepatic negative GI ROS, Neg liver ROS,   Endo/Other  negative endocrine ROS  Renal/GU negative Renal ROS  negative genitourinary   Musculoskeletal   Abdominal   Peds  Hematology negative hematology ROS (+)   Anesthesia Other Findings   Reproductive/Obstetrics negative OB ROS                             Anesthesia Physical Anesthesia Plan  ASA: I  Anesthesia Plan: General   Post-op Pain Management:    Induction: Intravenous  Airway Management Planned: Oral ETT  Additional Equipment:   Intra-op Plan:   Post-operative Plan: Extubation in OR  Informed Consent: I have reviewed the patients History and Physical, chart, labs and discussed the procedure including the risks, benefits and alternatives for the proposed anesthesia with the patient or authorized representative who has indicated his/her understanding and acceptance.   Dental Advisory Given  Plan Discussed with: CRNA and Surgeon  Anesthesia Plan Comments:         Anesthesia Quick Evaluation

## 2015-05-25 NOTE — Transfer of Care (Signed)
Immediate Anesthesia Transfer of Care Note  Patient: [name]  Procedure(s) Performed: Procedure(s): LAPAROSCOPIC, POSSIBLE OPEN RIGHT INGUINAL HERNIA WITH MESH (Right) INSERTION OF MESH (Right)  Patient Location: PACU  Anesthesia Type:General  Level of Consciousness: awake, alert  and oriented  Airway & Oxygen Therapy: Patient Spontanous Breathing and Patient connected to face mask oxygen  Post-op Assessment: Report given to RN and Post -op Vital signs reviewed and stable  Post vital signs: Reviewed and stable  Last Vitals:  Filed Vitals:   05/25/15 0821  BP: 133/84  Pulse: 83  Temp: 36.8 C  Resp: 18    Complications: No apparent anesthesia complications

## 2015-05-25 NOTE — Op Note (Addendum)
05/25/2015  11:51 AM  PATIENT:  [name]  65 y.o. male  PRE-OPERATIVE DIAGNOSIS:  Right inguinal hernia  POST-OPERATIVE DIAGNOSIS:  Right indirect inguinal hernia, moderate sized  PROCEDURE:  Procedure(s): LAPAROSCOPIC RIGHT INGUINAL HERNIA WITH MESH (Right) INSERTION OF MESH (Right)  SURGEON:  Surgeon(s) and Role:    * Ralene Ok, MD - Primary  ASSISTANTS: none   ANESTHESIA:   local and general  EBL:  Total I/O In: 1000 [I.V.:1000] Out: 175 [Urine:150; Blood:5]  BLOOD ADMINISTERED:none  DRAINS: none   LOCAL MEDICATIONS USED:  BUPIVICAINE   SPECIMEN:  No Specimen  DISPOSITION OF SPECIMEN:  N/A  COUNTS:  YES  TOURNIQUET:  * No tourniquets in log *  DICTATION: .Dragon Dictation   Counts: reported as correct x 2  Findings:  The patient had a small right indirect hernia  Indications for procedure:  The patient is a 65 year old male with a right inguinal hernia for several months. Patient complained of symptomatology to his right inguinal area. The patient was taken back for elective inguinal hernia repair.  Details of the procedure: The patient was taken back to the operating room. The patient was placed in supine position with bilateral SCDs in place.  The patient was prepped and draped in the usual sterile fashion.  After appropriate anitbiotics were confirmed, a time-out was confirmed and all facts were verified.  0.25% Marcaine was used to infiltrate the umbilical area. A 11-blade was used to cut down the skin and blunt dissection was used to get the anterior fashion.  The anterior fascia was incised approximately 1 cm and the muscles were retracted laterally. Blunt dissection was then used to create a space in the preperitoneal area. At this time a 10 mm camera was then introduced into the space and advanced the pubic tubercle and a 12 mm trocar was placed over this and insufflation was started.  At this time and space was created from medial to laterally  the preperitoneal space.  Cooper's ligament was initially cleaned off.  The hernia sac was identified in the indirect space. Dissection of the hernia sac was undertaken the vas deferens was identified and protected in all parts of the case.  There was a small tear into the hernia sac. A Veress needle right upper quadrant to help evacuate the intraperitoneal air.    Once the hernia sac was taken down to approximately the umbilicus a Bard 3D Max mesh, size: Large, was  introduced into the preperitoneal space.  The mesh was brought over to cover the direct and indirect hernia spaces.  This was anchored into place and secured to Cooper's ligament with 4.61mm staples from a Cov[place] hernia stapler. It was anchored to the anterior abdominal wall with 4.8 mm staples. The hernia sac was seen lying posterior to the mesh. There was no staples placed laterally. The insufflation was evacuated and the peritoneum was seen posterior to the mesh. The trochars were removed. The anterior fascia was reapproximated using #1 Vicryl on a UR- 6.  Intra-abdominal air was evacuated and the Veress needle removed. The skin was reapproximated using 4-0 Monocryl subcuticular fashion the patient was awakened from general anesthesia and taken to recovery in stable condition.   PLAN OF CARE: Discharge to home after PACU  PATIENT DISPOSITION:  PACU - hemodynamically stable.   Delay start of Pharmacological VTE agent (>24hrs) due to surgical blood loss or risk of bleeding: not applicable

## 2015-09-30 ENCOUNTER — Other Ambulatory Visit (HOSPITAL_COMMUNITY)
Admission: RE | Admit: 2015-09-30 | Discharge: 2015-09-30 | Disposition: A | Discharge status: Home / Self Care | Payer: Medicare Other | Source: Skilled Nursing Facility | Attending: Urology | Admitting: Urology

## 2015-09-30 ENCOUNTER — Ambulatory Visit (INDEPENDENT_AMBULATORY_CARE_PROVIDER_SITE_OTHER): Payer: Medicare Other | Admitting: Urology

## 2015-09-30 DIAGNOSIS — N393 Stress incontinence (female) (male): Secondary | ICD-10-CM | POA: Insufficient documentation

## 2015-09-30 DIAGNOSIS — Z8546 Personal history of malignant neoplasm of prostate: Secondary | ICD-10-CM | POA: Diagnosis not present

## 2015-09-30 DIAGNOSIS — N4341 Spermatocele of epididymis, single: Secondary | ICD-10-CM | POA: Diagnosis not present

## 2015-09-30 DIAGNOSIS — N5231 Erectile dysfunction following radical prostatectomy: Secondary | ICD-10-CM | POA: Diagnosis not present

## 2015-09-30 LAB — URINALYSIS, ROUTINE W REFLEX MICROSCOPIC
BILIRUBIN URINE: NEGATIVE
GLUCOSE, UA: NEGATIVE mg/dL
Ketones, ur: NEGATIVE mg/dL
Leukocytes, UA: NEGATIVE
Nitrite: NEGATIVE
PH: 6 (ref 5.0–8.0)
Protein, ur: NEGATIVE mg/dL
SPECIFIC GRAVITY, URINE: 1.025 (ref 1.005–1.030)

## 2015-09-30 LAB — URINE MICROSCOPIC-ADD ON
BACTERIA UA: NONE SEEN
SQUAMOUS EPITHELIAL / LPF: NONE SEEN
WBC, UA: NONE SEEN WBC/hpf (ref 0–5)

## 2015-12-16 ENCOUNTER — Ambulatory Visit (HOSPITAL_COMMUNITY)
Admission: RE | Admit: 2015-12-16 | Discharge: 2015-12-16 | Disposition: A | Discharge status: Home / Self Care | Payer: Medicare Other | Source: Ambulatory Visit | Attending: Internal Medicine | Admitting: Internal Medicine

## 2015-12-16 ENCOUNTER — Other Ambulatory Visit: Payer: Self-pay

## 2015-12-16 ENCOUNTER — Other Ambulatory Visit (HOSPITAL_COMMUNITY): Payer: Self-pay | Admitting: Internal Medicine

## 2015-12-16 ENCOUNTER — Encounter (HOSPITAL_COMMUNITY): Payer: Self-pay

## 2015-12-16 ENCOUNTER — Other Ambulatory Visit (HOSPITAL_COMMUNITY): Payer: Self-pay

## 2015-12-16 ENCOUNTER — Inpatient Hospital Stay (HOSPITAL_COMMUNITY): Payer: Medicare Other

## 2015-12-16 ENCOUNTER — Inpatient Hospital Stay (HOSPITAL_COMMUNITY)
Admission: EM | Admit: 2015-12-16 | Discharge: 2015-12-17 | DRG: 066 | Disposition: A | Discharge status: Home / Self Care | Payer: Medicare Other | Attending: Internal Medicine | Admitting: Internal Medicine

## 2015-12-16 ENCOUNTER — Emergency Department (HOSPITAL_COMMUNITY): Payer: Medicare Other

## 2015-12-16 DIAGNOSIS — R42 Dizziness and giddiness: Secondary | ICD-10-CM

## 2015-12-16 DIAGNOSIS — R110 Nausea: Secondary | ICD-10-CM

## 2015-12-16 DIAGNOSIS — R51 Headache: Secondary | ICD-10-CM

## 2015-12-16 DIAGNOSIS — R519 Headache, unspecified: Secondary | ICD-10-CM

## 2015-12-16 DIAGNOSIS — I10 Essential (primary) hypertension: Secondary | ICD-10-CM | POA: Diagnosis present

## 2015-12-16 DIAGNOSIS — I6502 Occlusion and stenosis of left vertebral artery: Secondary | ICD-10-CM

## 2015-12-16 DIAGNOSIS — Z8546 Personal history of malignant neoplasm of prostate: Secondary | ICD-10-CM | POA: Diagnosis not present

## 2015-12-16 DIAGNOSIS — I63212 Cerebral infarction due to unspecified occlusion or stenosis of left vertebral arteries: Secondary | ICD-10-CM | POA: Diagnosis not present

## 2015-12-16 DIAGNOSIS — I639 Cerebral infarction, unspecified: Secondary | ICD-10-CM | POA: Diagnosis not present

## 2015-12-16 DIAGNOSIS — I63219 Cerebral infarction due to unspecified occlusion or stenosis of unspecified vertebral arteries: Secondary | ICD-10-CM | POA: Diagnosis present

## 2015-12-16 DIAGNOSIS — R0602 Shortness of breath: Secondary | ICD-10-CM | POA: Diagnosis not present

## 2015-12-16 DIAGNOSIS — E785 Hyperlipidemia, unspecified: Secondary | ICD-10-CM | POA: Diagnosis not present

## 2015-12-16 LAB — CBC WITH DIFFERENTIAL/PLATELET
BASOS ABS: 0 10*3/uL (ref 0.0–0.1)
Basophils Relative: 0 %
EOS ABS: 0.2 10*3/uL (ref 0.0–0.7)
Eosinophils Relative: 2 %
HCT: 48.2 % (ref 39.0–52.0)
HEMOGLOBIN: 16.5 g/dL (ref 13.0–17.0)
LYMPHS ABS: 1.8 10*3/uL (ref 0.7–4.0)
LYMPHS PCT: 19 %
MCH: 30.9 pg (ref 26.0–34.0)
MCHC: 34.2 g/dL (ref 30.0–36.0)
MCV: 90.3 fL (ref 78.0–100.0)
Monocytes Absolute: 0.7 10*3/uL (ref 0.1–1.0)
Monocytes Relative: 7 %
NEUTROS PCT: 72 %
Neutro Abs: 7.1 10*3/uL (ref 1.7–7.7)
PLATELETS: 265 10*3/uL (ref 150–400)
RBC: 5.34 MIL/uL (ref 4.22–5.81)
RDW: 12.4 % (ref 11.5–15.5)
WBC: 9.9 10*3/uL (ref 4.0–10.5)

## 2015-12-16 LAB — I-STAT CHEM 8, ED
BUN: 18 mg/dL (ref 6–20)
CALCIUM ION: 1.23 mmol/L (ref 1.15–1.40)
CHLORIDE: 101 mmol/L (ref 101–111)
Creatinine, Ser: 0.8 mg/dL (ref 0.61–1.24)
Glucose, Bld: 90 mg/dL (ref 65–99)
HCT: 50 % (ref 39.0–52.0)
Hemoglobin: 17 g/dL (ref 13.0–17.0)
POTASSIUM: 4.3 mmol/L (ref 3.5–5.1)
SODIUM: 140 mmol/L (ref 135–145)
TCO2: 28 mmol/L (ref 0–100)

## 2015-12-16 LAB — TROPONIN I

## 2015-12-16 MED ORDER — ENOXAPARIN SODIUM 40 MG/0.4ML ~~LOC~~ SOLN
40.0000 mg | SUBCUTANEOUS | Status: DC
  Administered 2015-12-16: 40 mg via SUBCUTANEOUS
  Filled 2015-12-16: qty 0.4

## 2015-12-16 MED ORDER — STROKE: EARLY STAGES OF RECOVERY BOOK
Freq: Once | Status: DC
  Filled 2015-12-16: qty 1

## 2015-12-16 MED ORDER — OMEGA-3-ACID ETHYL ESTERS 1 G PO CAPS
1.0000 g | ORAL_CAPSULE | Freq: Every day | ORAL | Status: DC
  Administered 2015-12-16 – 2015-12-17 (×2): 1 g via ORAL
  Filled 2015-12-16 (×2): qty 1

## 2015-12-16 MED ORDER — SENNOSIDES-DOCUSATE SODIUM 8.6-50 MG PO TABS: 1.0000 | ORAL_TABLET | Freq: Every evening | ORAL | Status: DC | PRN

## 2015-12-16 MED ORDER — SODIUM CHLORIDE 0.9 % IV SOLN
INTRAVENOUS | Status: AC
  Administered 2015-12-16: 21:00:00 via INTRAVENOUS

## 2015-12-16 MED ORDER — IOPAMIDOL (ISOVUE-370) INJECTION 76%
80.0000 mL | Freq: Once | INTRAVENOUS | Status: AC | PRN
  Administered 2015-12-16: 16:00:00 via INTRAVENOUS

## 2015-12-16 MED ORDER — LABETALOL HCL 5 MG/ML IV SOLN: 5.0000 mg | INTRAVENOUS | Status: DC | PRN

## 2015-12-16 MED ORDER — ASPIRIN 300 MG RE SUPP: 300.0000 mg | Freq: Every day | RECTAL | Status: DC

## 2015-12-16 MED ORDER — ASPIRIN 325 MG PO TABS
325.0000 mg | ORAL_TABLET | Freq: Every day | ORAL | Status: DC
  Administered 2015-12-16 – 2015-12-17 (×2): 325 mg via ORAL
  Filled 2015-12-16 (×2): qty 1

## 2015-12-16 MED ORDER — SODIUM CHLORIDE 0.9 % IV SOLN
INTRAVENOUS | Status: DC
  Administered 2015-12-16: 15:00:00 via INTRAVENOUS

## 2015-12-16 NOTE — ED Notes (Signed)
Patient transported to CT 

## 2015-12-16 NOTE — ED Triage Notes (Signed)
Pt reports started feeling lightheaded last Friday.  Reports symptoms got worse Tuesday while he was on his tractor.  Pt went to pcp on Wednesday and they gave him a steroid shot and antibiotic for possible ear infection.  Reports wasn't any better so pcp ordered MRI today.  Pt says he was told the MRI showed a possible stroke and/or  aneurysm .  Denies headache.  Denies any dizziness at this time.

## 2015-12-16 NOTE — ED Provider Notes (Signed)
[facility] DEPT Provider Note   CSN: [id number] Arrival date & time: 12/16/15  1345     History   Chief Complaint Chief Complaint  Patient presents with  . Dizziness  . Other    abnormal mri    HPI [name] is a 65 y.o. male.  He presents for evaluation of abnormal MR head, ordered by PCP for dizziness. MR showed a cerebellar stroke and possible occluded left carotid artery. Patient reports gait problem, and intermittent dizziness, for 7 days, starting spontaneously. No prior similar problems. He has mild achiness in his left posterior head. He came here by private vehicle. He denies fever, chills, cough, shortness of breath, chest pain, back pain or abdominal pain. There are no other known modifying factors.  HPI  Past Medical History:  Diagnosis Date  . Blood donor, platelets    does monthly -every 1st Monday - last 05-16-15  . Fatty tumor    Multiple over body  . Hematochezia   . Hemorrhoids   . Prostate cancer ([place]) 03/26/2005    Patient Active Problem List   Diagnosis Date Noted  . Acute ischemic stroke ([place]) 12/16/2015  . 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 CVA (cerebral infarction) 12/16/2015    Past Surgical History:  Procedure Laterality Date  . CATARACT EXTRACTION Left    one eye-left  . catract    . CHOLECYSTECTOMY  11/08/2009   laparoscopic  . COLONOSCOPY  09/24/05  . COLONOSCOPY  10/05/02  . COLONOSCOPY  12/08/2010   Procedure: COLONOSCOPY;  Surgeon: Rogene Houston, MD;  Location: [facility]ITE;  Service: Endoscopy;  Laterality: N/A;  1:05  . HERNIA REPAIR     Umbilical hernia repair.with Robotic laparaoscpic surgery for Prostate.  . INGUINAL HERNIA REPAIR Right 05/25/2015   Procedure: LAPAROSCOPIC,  RIGHT INGUINAL HERNIA WITH MESH;  Surgeon: Ralene Ok, MD;  Location: [facility] ORS;  Service: General;  Laterality: Right;  . INSERTION OF MESH Right 05/25/2015   Procedure: INSERTION OF MESH;  Surgeon: Ralene Ok, MD;  Location: [facility] ORS;  Service: General;  Laterality: Right;  . PROSTATE SURGERY  10 Years ago   Patient describes as this being done 10 years ago, Prostate Trimming  . ROBOT ASSISTED LAPAROSCOPIC RADICAL PROSTATECTOMY  2006  . TONSILLECTOMY     As a Child       Home Medications    Prior to Admission medications   Medication Sig Start Date End Date Taking? Authorizing Provider  acetaminophen (TYLENOL) 500 MG tablet Take 1,000 mg by mouth daily as needed for mild pain, moderate pain or headache.   Yes Historical Provider, MD  doxycycline (VIBRA-TABS) 100 MG tablet Take 100 mg by mouth 2 (two) times daily. 7 day course starting on 12/14/2015   Yes Historical Provider, MD  fish oil-omega-3 fatty acids 1000 MG capsule Take 1 g by mouth daily.     Yes Historical Provider, MD    Family History Family History  Problem Relation Age of Onset  . Healthy Sister     Social History Social History  Substance Use Topics  . Smoking status: Never Smoker  . Smokeless tobacco: Never Used  . Alcohol use Yes     Comment: Patient states that he may drink a six pack of beer a year.     Allergies   Review of patient's allergies indicates no known allergies.   Review of Systems Review of Systems  All other systems reviewed and are negative.  Physical Exam Updated Vital Signs BP (!) 152/106 (BP Location: Left Arm)   Pulse 72   Temp 97.8 F (36.6 C) (Oral)   Resp 20   Ht 6\' 2"  (1.88 m)   Wt 210 lb (95.3 kg)   SpO2 95%   BMI 26.96 kg/m   Physical Exam  Constitutional: He is oriented to person, place, and time. He appears well-developed and well-nourished.  HENT:  Head: Normocephalic and atraumatic.  Right Ear: External ear normal.  Left Ear: External ear normal.  Eyes: Conjunctivae and EOM are normal. Pupils are equal, round, and reactive to light.  Neck: Normal range of motion and phonation normal. Neck supple.  Cardiovascular: Normal rate, regular rhythm and normal  heart sounds.   Pulmonary/Chest: Effort normal and breath sounds normal. He exhibits no bony tenderness.  Abdominal: Soft. There is no tenderness.  Musculoskeletal: Normal range of motion.  Neurological: He is alert and oriented to person, place, and time. No cranial nerve deficit or sensory deficit. He exhibits normal muscle tone. Coordination normal.  No dysarthria and aphasia or nystagmus. No pronator drift.  Skin: Skin is warm, dry and intact.  Psychiatric: He has a normal mood and affect. His behavior is normal. Judgment and thought content normal.  Nursing note and vitals reviewed.    ED Treatments / Results  Labs (all labs ordered are listed, but only abnormal results are displayed) Labs Reviewed  CBC WITH DIFFERENTIAL/PLATELET  I-STAT CHEM 8, ED    EKG  EKG Interpretation None       Radiology Mr [facility] Wo Contrast  Addendum Date: 12/16/2015   ADDENDUM REPORT: 12/16/2015 13:58 ADDENDUM: Study discussed by telephone with Dr. Redmond School on 12/16/2015 at 1338 hours. We discussed that I favor progressive atherosclerotic or thromboembolic disease as the etiology of the distal left vertebral artery occlusion, especially in light of the up to severe anterior circulation atherosclerosis. Neck CTA with contrast would be necessary to exclude left vertebral artery dissection with certainty. Electronically Signed   By: Genevie Ann M.D.   On: 12/16/2015 13:58   Result Date: 12/16/2015 CLINICAL DATA:  65 year old male with posterior head pain and dizziness for 1 week. No known injury. Nausea. Initial encounter. EXAM: MRI HEAD WITHOUT CONTRAST MRA HEAD WITHOUT CONTRAST TECHNIQUE: Multiplanar, multiecho pulse sequences of the brain and surrounding structures were obtained without intravenous contrast. Angiographic images of the head were obtained using MRA technique without contrast. COMPARISON:  None. FINDINGS: MRI HEAD FINDINGS 3 cm area of abnormal signal in the posterior left cerebellum  corresponding with moderately restricted diffusion, T2 and FLAIR hyperintensity. No associated hemorrhage. No associated mass effect. Several surrounding punctate areas of signal abnormality including abnormal diffusion suggesting other regional lacunar type infarcts. Major intracranial vascular flow voids are preserved, although there is heterogeneity of the distal left vertebral artery suggesting atherosclerosis. See below. No right cerebellar or brainstem diffusion abnormality. No supra tentorial diffusion restriction. No midline shift, mass effect, evidence of mass lesion, ventriculomegaly, extra-axial collection or acute intracranial hemorrhage. Cervicomedullary junction and pituitary are within normal limits. Negative visualized cervical spine. Scattered mild for age supratentorial nonspecific cerebral white matter T2 and FLAIR hyperintensity. No cortical encephalomalacia or chronic cerebral blood products identified. Visible internal auditory structures appear normal. Trace paranasal sinus mucosal thickening. Otherwise paranasal sinuses and mastoids are well pneumatized. Postoperative changes to the left globe. Otherwise negative orbits soft tissues. Negative scalp soft tissues. Normal bone marrow signal. MRA HEAD FINDINGS Preserved antegrade flow signal in the distal  left V2 segment, but loss of flow signal in the left V3 and left V4 segments. Filling defect in the distal left vertebral artery near the left PICA origin, with reconstituted flow in the left PICA. Reconstituted flow in a short segment of the left V4 segment distal to the PICA, but high-grade stenosis and filling defect at the left vertebrobasilar junction. Superimposed incidental fenestration of the vertebrobasilar junction. Non dominant appearing distal right vertebral artery which is diminutive beyond the right PICA origin. Basilar artery is patent but diminutive and irregular. Mild to moderate stenosis at the mid basilar. SCA origins are  normal. Fetal type bilateral PCA origins. Normal bilateral PCA branches. Antegrade flow in both ICA siphons. No siphon stenosis. Normal ophthalmic and posterior communicating artery origins. Patent carotid termini. Moderate to severe stenosis at the left ACA origin. A1 segments, anterior communicating artery, and visualized ACA branches otherwise are within normal limits. Mild to moderate irregularity and stenosis of the right MCA M1 segment. The right MCA trifurcation is patent, with evidence of a 2 mm aneurysm directed superiorly from the trifurcation. Visualized right MCA branches are within normal limits. Normal left MCA origin but severe stenosis in the mid left MCA M1 segment, just proximal to the left anterior temporal artery origin. Mild M1 irregularity and stenosis distal to the left anterior temporal. Normal left MCA bifurcation. Negative visualized left MCA branches. IMPRESSION: 1. Acute to subacute infarcts in the left cerebellum. Mild cerebellar edema. No associated hemorrhage or mass effect. 2. Abnormal distal left vertebral artery is occluded or nearly occluded in the left V3 and V4 segments with filling defect at the left vertebrobasilar junction. Despite this there is reconstituted flow to the left PICA. This could be thromboembolic disease or less likely sequelae of vertebral artery dissection. Neck CTA with contrast might characterize further. 3. Severe stenosis which appears atherosclerotic in nature in the left MCA M1 segment and at the left ACA origin. Mild to moderate mid basilar artery and right MCA M1 stenosis. 4. Tiny right MCA trifurcation aneurysm, 2 mm. Electronically Signed: By: Genevie Ann M.D. On: 12/16/2015 13:30   Mr Brain Wo Contrast  Addendum Date: 12/16/2015   ADDENDUM REPORT: 12/16/2015 13:58 ADDENDUM: Study discussed by telephone with Dr. Redmond School on 12/16/2015 at 1338 hours. We discussed that I favor progressive atherosclerotic or thromboembolic disease as the etiology of  the distal left vertebral artery occlusion, especially in light of the up to severe anterior circulation atherosclerosis. Neck CTA with contrast would be necessary to exclude left vertebral artery dissection with certainty. Electronically Signed   By: Genevie Ann M.D.   On: 12/16/2015 13:58   Result Date: 12/16/2015 CLINICAL DATA:  65 year old male with posterior head pain and dizziness for 1 week. No known injury. Nausea. Initial encounter. EXAM: MRI HEAD WITHOUT CONTRAST MRA HEAD WITHOUT CONTRAST TECHNIQUE: Multiplanar, multiecho pulse sequences of the brain and surrounding structures were obtained without intravenous contrast. Angiographic images of the head were obtained using MRA technique without contrast. COMPARISON:  None. FINDINGS: MRI HEAD FINDINGS 3 cm area of abnormal signal in the posterior left cerebellum corresponding with moderately restricted diffusion, T2 and FLAIR hyperintensity. No associated hemorrhage. No associated mass effect. Several surrounding punctate areas of signal abnormality including abnormal diffusion suggesting other regional lacunar type infarcts. Major intracranial vascular flow voids are preserved, although there is heterogeneity of the distal left vertebral artery suggesting atherosclerosis. See below. No right cerebellar or brainstem diffusion abnormality. No supra tentorial diffusion restriction. No midline shift,  mass effect, evidence of mass lesion, ventriculomegaly, extra-axial collection or acute intracranial hemorrhage. Cervicomedullary junction and pituitary are within normal limits. Negative visualized cervical spine. Scattered mild for age supratentorial nonspecific cerebral white matter T2 and FLAIR hyperintensity. No cortical encephalomalacia or chronic cerebral blood products identified. Visible internal auditory structures appear normal. Trace paranasal sinus mucosal thickening. Otherwise paranasal sinuses and mastoids are well pneumatized. Postoperative changes to  the left globe. Otherwise negative orbits soft tissues. Negative scalp soft tissues. Normal bone marrow signal. MRA HEAD FINDINGS Preserved antegrade flow signal in the distal left V2 segment, but loss of flow signal in the left V3 and left V4 segments. Filling defect in the distal left vertebral artery near the left PICA origin, with reconstituted flow in the left PICA. Reconstituted flow in a short segment of the left V4 segment distal to the PICA, but high-grade stenosis and filling defect at the left vertebrobasilar junction. Superimposed incidental fenestration of the vertebrobasilar junction. Non dominant appearing distal right vertebral artery which is diminutive beyond the right PICA origin. Basilar artery is patent but diminutive and irregular. Mild to moderate stenosis at the mid basilar. SCA origins are normal. Fetal type bilateral PCA origins. Normal bilateral PCA branches. Antegrade flow in both ICA siphons. No siphon stenosis. Normal ophthalmic and posterior communicating artery origins. Patent carotid termini. Moderate to severe stenosis at the left ACA origin. A1 segments, anterior communicating artery, and visualized ACA branches otherwise are within normal limits. Mild to moderate irregularity and stenosis of the right MCA M1 segment. The right MCA trifurcation is patent, with evidence of a 2 mm aneurysm directed superiorly from the trifurcation. Visualized right MCA branches are within normal limits. Normal left MCA origin but severe stenosis in the mid left MCA M1 segment, just proximal to the left anterior temporal artery origin. Mild M1 irregularity and stenosis distal to the left anterior temporal. Normal left MCA bifurcation. Negative visualized left MCA branches. IMPRESSION: 1. Acute to subacute infarcts in the left cerebellum. Mild cerebellar edema. No associated hemorrhage or mass effect. 2. Abnormal distal left vertebral artery is occluded or nearly occluded in the left V3 and V4 segments  with filling defect at the left vertebrobasilar junction. Despite this there is reconstituted flow to the left PICA. This could be thromboembolic disease or less likely sequelae of vertebral artery dissection. Neck CTA with contrast might characterize further. 3. Severe stenosis which appears atherosclerotic in nature in the left MCA M1 segment and at the left ACA origin. Mild to moderate mid basilar artery and right MCA M1 stenosis. 4. Tiny right MCA trifurcation aneurysm, 2 mm. Electronically Signed: By: Genevie Ann M.D. On: 12/16/2015 13:30   Ct Angio Neck W And/or Wo Contrast  Result Date: 12/16/2015 CLINICAL DATA:  Dizziness. Left cerebellar infarcts on MRI with occluded distal left vertebral artery. EXAM: CT ANGIOGRAPHY NECK TECHNIQUE: Multidetector CT imaging of the neck was performed using the standard protocol during bolus administration of intravenous contrast. Multiplanar CT image reconstructions and MIPs were obtained to evaluate the vascular anatomy. Carotid stenosis measurements (when applicable) are obtained utilizing NASCET criteria, using the distal internal carotid diameter as the denominator. CONTRAST:  80 mL Isovue 370 COMPARISON:  Head MRI/ MRA earlier today FINDINGS: Aortic arch: 3 vessel aortic arch with mild atherosclerotic calcification. Widely patent arch vessel origins. Right carotid system: Patent without stenosis. Minimal atherosclerotic plaque at the carotid bifurcation. Left carotid system: Patent without stenosis. Minimal atherosclerotic plaque at the carotid bifurcation. Vertebral arteries: The vertebral arteries  are widely patent in the neck with the left being mildly dominant. The left V3 segment is normal in caliber and widely patent without evidence of dissection. Lack of flow related enhancement in the distal left V3 segment on the earlier MRA may have been secondary to slow flow due to high-grade distal stenosis. Intracranially, there is a similar appearance of severe stenosis/  nearly occlusive thrombus of the left V4 segment just proximal to the PICA origin. High-grade stenosis/ nonocclusive thrombus in the proximal basilar artery near the vertebrobasilar junction as seen on earlier MRA is incompletely imaged. Both PICA is appear patent. Skeleton: Mild cervical spondylosis. Other neck: No mass. Upper chest: Small calcified granuloma in the right lung apex. IMPRESSION: 1. Normal appearance of the left vertebral artery in the neck, including the V3 segment, without evidence of dissection. 2. 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 3. No cervical carotid artery stenosis. Electronically Signed   By: Logan Bores M.D.   On: 12/16/2015 16:45    17:00- paged Neuro hospitalist at [facility], and spoke with Dr. Kathrynn Speed. He reviewed the imaging and clinical history with me. He recommends initiating dual antiplatelet therapy with Plavix and aspirin. He recommends cardiac echo, and check lipids to ensure on proper dosing of statins. He will not require a carotid Doppler. He recommends hospitalization for further management, but not transferred to [facility].  5:05 PM-Consult complete with Hospitalist. Patient case explained and discussed. He agrees to admit patient for further evaluation and treatment. Call ended at 15:16  Procedures Procedures (including critical care time)  Medications Ordered in ED Medications  0.9 %  sodium chloride infusion ( Intravenous New Bag/Given 12/16/15 1433)  iopamidol (ISOVUE-370) 76 % injection 80 mL ( Intravenous Contrast Given 12/16/15 1602)     Initial Impression / Assessment and Plan / ED Course  I have reviewed the triage vital signs and the nursing notes.  Pertinent labs & imaging results that were available during my care of the patient were reviewed by me and considered in my medical decision making (see chart  for details).  Clinical Course      Date: 12/16/15  Rate: 68  Rhythm: normal sinus rhythm  QRS Axis: normal  PR and QT Intervals: normal  ST/T Wave abnormalities: nonspecific ST changes  PR and QRS Conduction Disutrbances:none  Narrative Interpretation:   Old EKG Reviewed: none available    Medications  0.9 %  sodium chloride infusion ( Intravenous New Bag/Given 12/16/15 1433)  iopamidol (ISOVUE-370) 76 % injection 80 mL ( Intravenous Contrast Given 12/16/15 1602)    Patient Vitals for the past 24 hrs:  BP Temp Temp src Pulse Resp SpO2 Height Weight  12/16/15 1532 (!) 152/106 - - 72 20 95 % - -  12/16/15 1530 (!) 152/106 - - - 11 - - -  12/16/15 1515 - - - 73 17 95 % - -  12/16/15 1500 137/98 - - 67 19 98 % - -  12/16/15 1448 (!) 144/104 - - 73 18 96 % - -  12/16/15 1409 (!) 150/102 97.8 F (36.6 C) Oral 66 20 98 % 6\' 2"  (1.88 m) 210 lb (95.3 kg)    5:20 PM Reevaluation with update and discussion. After initial assessment and treatment, an updated evaluation reveals No change in clinical status. Findings discussed with patient and all of their questions answered. Mischelle Reeg L    Final  Clinical Impressions(s) / ED Diagnoses   Final diagnoses:  Cerebral infarction due to unspecified mechanism  Vertebral artery stenosis, left    Nursing Notes Reviewed/ Care Coordinated Applicable Imaging Reviewed Interpretation of Laboratory Data incorporated into ED treatment   Plan: Admit   New Prescriptions New Prescriptions   No medications on file     Daleen Bo, MD 12/16/15 1721

## 2015-12-16 NOTE — H&P (Signed)
History and Physical    [name] [id number] DOB: [birth year] DOA: 12/16/2015  PCP: Glo Herring., MD   Patient coming from: Home  Chief Complaint: Dizziness   HPI: [name] is a 65 y.o. male with medical history significant for prostate cancer status post resection and hyperlipidemia who presents to the emergency department with 1 week of dizziness. Patient reports that he had been enjoying good health until the acute development of lightheadedness upon standing on 12/09/2015. He felt as though he might lose consciousness, but the sensation resolved over the course of seconds. Patient experienced another similar episode, this time while seated, on 12/11/2015, also resolving spontaneously and quickly. 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 He saw his PCP the following day, was suspected of having vertigo secondary to inner ear infection and was given a steroid injection and doxycycline. Symptoms continued unchanged despite this therapy and the patient called his PCP back today to report this. Outpatient MRI/MRA were ordered and findings concerning for acute/subacute infarctions in the left cerebellum with severe stenosis/occlusion of a vertebral artery segment on the left. He was directed to the emergency department for further evaluation of this. Patient denies any chest pain or palpitations, denies dyspnea or cough, and denies any recent fevers or chills.  ED Course: Upon arrival to the ED, patient is found to be afebrile, saturating well on room air, hypertensive to 150/100, and with vitals otherwise stable. EKG reveals a sinus rhythm with minimal ST elevation in the anterior leads. Chemistry panel is unremarkable and CBC is also entirely within the normal limits. MRI/MRA were reviewed and notable for acute/subacute infarction the left cerebellum with mild edema, but no hemorrhage or  mass effect. The distal left vertebral artery is occluded or near occluded in V3 and V4 segments, likely secondary to thromboembolic disease, or less likely vertebral dissection. CTA of the head and neck was obtained in the emergency department to further evaluate for possible vertebral artery dissection. There was no dissection identified, but the V4 segment of the left vertebral artery in the proximal basilar artery are noted to have severe stenosis/near occlusion, secondary to thromboembolic disease or atherosclerosis. Neurology was consulted by the ED physician and advised that no emergent treatment was indicated and the patient should be admitted to [facility] for ongoing evaluation and management of acute/subacute ischemic CVA.  Review of Systems:  All other systems reviewed and apart from HPI, are negative.  Past Medical History:  Diagnosis Date  . Blood donor, platelets    does monthly -every 1st Monday - last 05-16-15  . Fatty tumor    Multiple over body  . Hematochezia   . Hemorrhoids   . Prostate cancer ([place]) 03/26/2005    Past Surgical History:  Procedure Laterality Date  . CATARACT EXTRACTION Left    one eye-left  . catract    . CHOLECYSTECTOMY  11/08/2009   laparoscopic  . COLONOSCOPY  09/24/05  . COLONOSCOPY  10/05/02  . COLONOSCOPY  12/08/2010   Procedure: COLONOSCOPY;  Surgeon: Rogene Houston, MD;  Location: [facility];  Service: Endoscopy;  Laterality: N/A;  1:05  . HERNIA REPAIR     Umbilical hernia repair.with Robotic laparaoscpic surgery for Prostate.  . INGUINAL HERNIA REPAIR Right 05/25/2015   Procedure: LAPAROSCOPIC,  RIGHT INGUINAL HERNIA WITH MESH;  Surgeon: Ralene Ok, MD;  Location: [facility] ORS;  Service: General;  Laterality: Right;  . INSERTION OF MESH Right 05/25/2015   Procedure: INSERTION OF MESH;  Surgeon: Ralene Ok, MD;  Location: [facility];  Service: General;  Laterality: Right;  . PROSTATE SURGERY  10 Years ago   Patient describes as  this being done 10 years ago, Prostate Trimming  . ROBOT ASSISTED LAPAROSCOPIC RADICAL PROSTATECTOMY  2006  . TONSILLECTOMY     As a Child     reports that he has never smoked. He has never used smokeless tobacco. He reports that he drinks alcohol. He reports that he does not use drugs.  No Known Allergies  Family History  Problem Relation Age of Onset  . Healthy Sister      Prior to Admission medications   Medication Sig Start Date End Date Taking? Authorizing Provider  acetaminophen (TYLENOL) 500 MG tablet Take 1,000 mg by mouth daily as needed for mild pain, moderate pain or headache.   Yes Historical Provider, MD  doxycycline (VIBRA-TABS) 100 MG tablet Take 100 mg by mouth 2 (two) times daily. 7 day course starting on 12/14/2015   Yes Historical Provider, MD  fish oil-omega-3 fatty acids 1000 MG capsule Take 1 g by mouth daily.     Yes Historical Provider, MD    Physical Exam: Vitals:   12/16/15 1500 12/16/15 1515 12/16/15 1530 12/16/15 1532  BP: 137/98  (!) 152/106 (!) 152/106  Pulse: 67 73  72  Resp: 19 17 11 20   Temp:      TempSrc:      SpO2: 98% 95%  95%  Weight:      Height:          Constitutional: NAD, calm, comfortable Eyes: PERTLA, lids and conjunctivae normal ENMT: Mucous membranes are moist. Posterior pharynx clear of any exudate or lesions.   Neck: normal, supple, no masses, no thyromegaly Respiratory: clear to auscultation bilaterally, no wheezing, no crackles. Normal respiratory effort.    Cardiovascular: S1 & S2 heard, regular rate and rhythm, no significant murmur. No extremity edema. 2+ pedal pulses. No carotid bruits. No significant JVD. Abdomen: No distension, no tenderness, no masses palpated. Bowel sounds normal.  Musculoskeletal: no clubbing / cyanosis. No joint deformity upper and lower extremities. Normal muscle tone.  Skin: no significant rashes, lesions, ulcers. Warm, dry, well-perfused. Neurologic: CN 2-12 grossly intact. Sensation intact,  DTR normal. Strength 5/5 in all 4 limbs. No dysmetria with finger-nose-finger.  Psychiatric: Normal judgment and insight. Alert and oriented x 3. Normal mood and affect.     Labs on Admission: I have personally reviewed following labs and imaging studies  CBC:  Recent Labs Lab 12/16/15 1427 12/16/15 1448  WBC 9.9  --   NEUTROABS 7.1  --   HGB 16.5 17.0  HCT 48.2 50.0  MCV 90.3  --   PLT 265  --    Basic Metabolic Panel:  Recent Labs Lab 12/16/15 1448  NA 140  K 4.3  CL 101  GLUCOSE 90  BUN 18  CREATININE 0.80   GFR: Estimated Creatinine Clearance: 105.6 mL/min (by C-G formula based on SCr of 0.8 mg/dL). Liver Function Tests: No results for input(s): AST, ALT, ALKPHOS, BILITOT, PROT, ALBUMIN in the last 168 hours. No results for input(s): LIPASE, AMYLASE in the last 168 hours. No results for input(s): AMMONIA in the last 168 hours. Coagulation Profile: No results for input(s): INR, PROTIME in the last 168 hours. Cardiac Enzymes: No results for input(s): CKTOTAL, CKMB, CKMBINDEX, TROPONINI in the last 168 hours. BNP (  last 3 results) No results for input(s): PROBNP in the last 8760 hours. HbA1C: No results for input(s): HGBA1C in the last 72 hours. CBG: No results for input(s): GLUCAP in the last 168 hours. Lipid Profile: No results for input(s): CHOL, HDL, LDLCALC, TRIG, CHOLHDL, LDLDIRECT in the last 72 hours. Thyroid Function Tests: No results for input(s): TSH, T4TOTAL, FREET4, T3FREE, THYROIDAB in the last 72 hours. Anemia Panel: No results for input(s): VITAMINB12, FOLATE, FERRITIN, TIBC, IRON, RETICCTPCT in the last 72 hours. Urine analysis:    Component Value Date/Time   COLORURINE YELLOW 09/30/2015 1006   APPEARANCEUR CLEAR 09/30/2015 1006   LABSPEC 1.025 09/30/2015 1006   PHURINE 6.0 09/30/2015 1006   GLUCOSEU NEGATIVE 09/30/2015 1006   HGBUR SMALL (A) 09/30/2015 1006   BILIRUBINUR NEGATIVE 09/30/2015 1006   KETONESUR NEGATIVE 09/30/2015 1006    PROTEINUR NEGATIVE 09/30/2015 1006   UROBILINOGEN 0.2 11/06/2009 1940   NITRITE NEGATIVE 09/30/2015 1006   LEUKOCYTESUR NEGATIVE 09/30/2015 1006   Sepsis Labs: @LABRCNTIP (procalcitonin:4,lacticidven:4) )No results found for this or any previous visit (from the past 240 hour(s)).   Radiological Exams on Admission: Ct Angio Neck W And/or Wo Contrast  Result Date: 12/16/2015 CLINICAL DATA:  Dizziness. Left cerebellar infarcts on MRI with occluded distal left vertebral artery. EXAM: CT ANGIOGRAPHY NECK TECHNIQUE: Multidetector CT imaging of the neck was performed using the standard protocol during bolus administration of intravenous contrast. Multiplanar CT image reconstructions and MIPs were obtained to evaluate the vascular anatomy. Carotid stenosis measurements (when applicable) are obtained utilizing NASCET criteria, using the distal internal carotid diameter as the denominator. CONTRAST:  80 mL Isovue 370 COMPARISON:  Head MRI/ MRA earlier today FINDINGS: Aortic arch: 3 vessel aortic arch with mild atherosclerotic calcification. Widely patent arch vessel origins. Right carotid system: Patent without stenosis. Minimal atherosclerotic plaque at the carotid bifurcation. Left carotid system: Patent without stenosis. Minimal atherosclerotic plaque at the carotid bifurcation. Vertebral arteries: The vertebral arteries are widely patent in the neck with the left being mildly dominant. The left V3 segment is normal in caliber and widely patent without evidence of dissection. Lack of flow related enhancement in the distal left V3 segment on the earlier MRA may have been secondary to slow flow due to high-grade distal stenosis. Intracranially, there is a similar appearance of severe stenosis/ nearly occlusive thrombus of the left V4 segment just proximal to the PICA origin. High-grade stenosis/ nonocclusive thrombus in the proximal basilar artery near the vertebrobasilar junction as seen on earlier MRA is  incompletely imaged. Both PICA is appear patent. Skeleton: Mild cervical spondylosis. Other neck: No mass. Upper chest: Small calcified granuloma in the right lung apex. IMPRESSION: 1. Normal appearance of the left vertebral artery in the neck, including the V3 segment, without evidence of dissection. 2. 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 3. No cervical carotid artery stenosis. Electronically Signed   By: Logan Bores M.D.   On: 12/16/2015 16:45   Mr Jodene Nam Head Wo Contrast  Addendum Date: 12/16/2015   ADDENDUM REPORT: 12/16/2015 13:58 ADDENDUM: Study discussed by telephone with Dr. Redmond School on 12/16/2015 at 1338 hours. We discussed that I favor progressive atherosclerotic or thromboembolic disease as the etiology of the distal left vertebral artery occlusion, especially in light of the up to severe anterior circulation atherosclerosis. Neck CTA with contrast would be necessary to exclude left vertebral artery dissection with certainty. Electronically Signed  By: Genevie Ann M.D.   On: 12/16/2015 13:58   Result Date: 12/16/2015 CLINICAL DATA:  65 year old male with posterior head pain and dizziness for 1 week. No known injury. Nausea. Initial encounter. EXAM: MRI HEAD WITHOUT CONTRAST MRA HEAD WITHOUT CONTRAST TECHNIQUE: Multiplanar, multiecho pulse sequences of the brain and surrounding structures were obtained without intravenous contrast. Angiographic images of the head were obtained using MRA technique without contrast. COMPARISON:  None. FINDINGS: MRI HEAD FINDINGS 3 cm area of abnormal signal in the posterior left cerebellum corresponding with moderately restricted diffusion, T2 and FLAIR hyperintensity. No associated hemorrhage. No associated mass effect. Several surrounding punctate areas of signal abnormality including abnormal diffusion suggesting other regional lacunar type  infarcts. Major intracranial vascular flow voids are preserved, although there is heterogeneity of the distal left vertebral artery suggesting atherosclerosis. See below. No right cerebellar or brainstem diffusion abnormality. No supra tentorial diffusion restriction. No midline shift, mass effect, evidence of mass lesion, ventriculomegaly, extra-axial collection or acute intracranial hemorrhage. Cervicomedullary junction and pituitary are within normal limits. Negative visualized cervical spine. Scattered mild for age supratentorial nonspecific cerebral white matter T2 and FLAIR hyperintensity. No cortical encephalomalacia or chronic cerebral blood products identified. Visible internal auditory structures appear normal. Trace paranasal sinus mucosal thickening. Otherwise paranasal sinuses and mastoids are well pneumatized. Postoperative changes to the left globe. Otherwise negative orbits soft tissues. Negative scalp soft tissues. Normal bone marrow signal. MRA HEAD FINDINGS Preserved antegrade flow signal in the distal left V2 segment, but loss of flow signal in the left V3 and left V4 segments. Filling defect in the distal left vertebral artery near the left PICA origin, with reconstituted flow in the left PICA. Reconstituted flow in a short segment of the left V4 segment distal to the PICA, but high-grade stenosis and filling defect at the left vertebrobasilar junction. Superimposed incidental fenestration of the vertebrobasilar junction. Non dominant appearing distal right vertebral artery which is diminutive beyond the right PICA origin. Basilar artery is patent but diminutive and irregular. Mild to moderate stenosis at the mid basilar. SCA origins are normal. Fetal type bilateral PCA origins. Normal bilateral PCA branches. Antegrade flow in both ICA siphons. No siphon stenosis. Normal ophthalmic and posterior communicating artery origins. Patent carotid termini. Moderate to severe stenosis at the left ACA  origin. A1 segments, anterior communicating artery, and visualized ACA branches otherwise are within normal limits. Mild to moderate irregularity and stenosis of the right MCA M1 segment. The right MCA trifurcation is patent, with evidence of a 2 mm aneurysm directed superiorly from the trifurcation. Visualized right MCA branches are within normal limits. Normal left MCA origin but severe stenosis in the mid left MCA M1 segment, just proximal to the left anterior temporal artery origin. Mild M1 irregularity and stenosis distal to the left anterior temporal. Normal left MCA bifurcation. Negative visualized left MCA branches. IMPRESSION: 1. Acute to subacute infarcts in the left cerebellum. Mild cerebellar edema. No associated hemorrhage or mass effect. 2. Abnormal distal left vertebral artery is occluded or nearly occluded in the left V3 and V4 segments with filling defect at the left vertebrobasilar junction. Despite this there is reconstituted flow to the left PICA. This could be thromboembolic disease or less likely sequelae of vertebral artery dissection. Neck CTA with contrast might characterize further. 3. Severe stenosis which appears atherosclerotic in nature in the left MCA M1 segment and at the left ACA origin. Mild to moderate mid basilar artery and right MCA M1 stenosis. 4. Tiny right  MCA trifurcation aneurysm, 2 mm. Electronically Signed: By: Genevie Ann M.D. On: 12/16/2015 13:30   Mr Brain Wo Contrast  Addendum Date: 12/16/2015   ADDENDUM REPORT: 12/16/2015 13:58 ADDENDUM: Study discussed by telephone with Dr. Redmond School on 12/16/2015 at 1338 hours. We discussed that I favor progressive atherosclerotic or thromboembolic disease as the etiology of the distal left vertebral artery occlusion, especially in light of the up to severe anterior circulation atherosclerosis. Neck CTA with contrast would be necessary to exclude left vertebral artery dissection with certainty. Electronically Signed   By: Genevie Ann  M.D.   On: 12/16/2015 13:58   Result Date: 12/16/2015 CLINICAL DATA:  65 year old male with posterior head pain and dizziness for 1 week. No known injury. Nausea. Initial encounter. EXAM: MRI HEAD WITHOUT CONTRAST MRA HEAD WITHOUT CONTRAST TECHNIQUE: Multiplanar, multiecho pulse sequences of the brain and surrounding structures were obtained without intravenous contrast. Angiographic images of the head were obtained using MRA technique without contrast. COMPARISON:  None. FINDINGS: MRI HEAD FINDINGS 3 cm area of abnormal signal in the posterior left cerebellum corresponding with moderately restricted diffusion, T2 and FLAIR hyperintensity. No associated hemorrhage. No associated mass effect. Several surrounding punctate areas of signal abnormality including abnormal diffusion suggesting other regional lacunar type infarcts. Major intracranial vascular flow voids are preserved, although there is heterogeneity of the distal left vertebral artery suggesting atherosclerosis. See below. No right cerebellar or brainstem diffusion abnormality. No supra tentorial diffusion restriction. No midline shift, mass effect, evidence of mass lesion, ventriculomegaly, extra-axial collection or acute intracranial hemorrhage. Cervicomedullary junction and pituitary are within normal limits. Negative visualized cervical spine. Scattered mild for age supratentorial nonspecific cerebral white matter T2 and FLAIR hyperintensity. No cortical encephalomalacia or chronic cerebral blood products identified. Visible internal auditory structures appear normal. Trace paranasal sinus mucosal thickening. Otherwise paranasal sinuses and mastoids are well pneumatized. Postoperative changes to the left globe. Otherwise negative orbits soft tissues. Negative scalp soft tissues. Normal bone marrow signal. MRA HEAD FINDINGS Preserved antegrade flow signal in the distal left V2 segment, but loss of flow signal in the left V3 and left V4 segments. Filling  defect in the distal left vertebral artery near the left PICA origin, with reconstituted flow in the left PICA. Reconstituted flow in a short segment of the left V4 segment distal to the PICA, but high-grade stenosis and filling defect at the left vertebrobasilar junction. Superimposed incidental fenestration of the vertebrobasilar junction. Non dominant appearing distal right vertebral artery which is diminutive beyond the right PICA origin. Basilar artery is patent but diminutive and irregular. Mild to moderate stenosis at the mid basilar. SCA origins are normal. Fetal type bilateral PCA origins. Normal bilateral PCA branches. Antegrade flow in both ICA siphons. No siphon stenosis. Normal ophthalmic and posterior communicating artery origins. Patent carotid termini. Moderate to severe stenosis at the left ACA origin. A1 segments, anterior communicating artery, and visualized ACA branches otherwise are within normal limits. Mild to moderate irregularity and stenosis of the right MCA M1 segment. The right MCA trifurcation is patent, with evidence of a 2 mm aneurysm directed superiorly from the trifurcation. Visualized right MCA branches are within normal limits. Normal left MCA origin but severe stenosis in the mid left MCA M1 segment, just proximal to the left anterior temporal artery origin. Mild M1 irregularity and stenosis distal to the left anterior temporal. Normal left MCA bifurcation. Negative visualized left MCA branches. IMPRESSION: 1. Acute to subacute infarcts in the left cerebellum. Mild cerebellar edema. No  associated hemorrhage or mass effect. 2. Abnormal distal left vertebral artery is occluded or nearly occluded in the left V3 and V4 segments with filling defect at the left vertebrobasilar junction. Despite this there is reconstituted flow to the left PICA. This could be thromboembolic disease or less likely sequelae of vertebral artery dissection. Neck CTA with contrast might characterize further.  3. Severe stenosis which appears atherosclerotic in nature in the left MCA M1 segment and at the left ACA origin. Mild to moderate mid basilar artery and right MCA M1 stenosis. 4. Tiny right MCA trifurcation aneurysm, 2 mm. Electronically Signed: By: Genevie Ann M.D. On: 12/16/2015 13:30    EKG: Independently reviewed. Sinus rhythm, minimal STE anteriorly  Assessment/Plan  1. Acute/subacute cerebellar infarctions, vertebral artery occlusion - Sxs began on 12/09/15 with lightheadedness/dizziness  - MRI/MRA with acute/subacte left cerebellar infarcts; mild edema, but no hemorrhage or mass-effect - CTA head and neck with no dissection, but left vertebral artery V4 segment occlusion/near-occlusion, felt to be thromboembolic vs atherosclerotic  - Neurology consultant indicates that no emergent intervention indicated and advises medical admission to [facility] - No tPA d/t 1 wk since symptom-onset, and mild sxs  - Has passed bedside swallow eval  - Monitor on telemetry for arrhythmia   - Check TTE for structural disease or thrombus  - Fasting lipid panel and A1c ordered for the am  - Had epistaxis with ASA in the past, agrees to try resuming ASA for secondary ppx with close monitoring - Has a reported statin intolerance and will be continued on omega-3  - PT, OT, SLP evals requested  2. Hyperlipidemia  - Pt reports hx of HLD and had been treated with statin, but was discontinued d/t intolerable myalgias  - Fasting lipid panel ordered; continue Lovaza for now   3. Hypertension  - Pressures mildly elevated in ED - Pt has been monitoring at home and has noted DBP to be elevated in the 90's this past week  - Monitor and treat prn    4. Hx of prostate cancer  - Status-post resection with no subsequent complication or recurrence     DVT prophylaxis: sq Lovenox Code Status: Full  Family Communication: Discussed with patient Disposition Plan: Admit to telemetry Consults called: Neurology Admission status:  Inpatient    Vianne Bulls, MD [facility] Pager [phone]  If 7PM-7AM, please contact night-coverage [email or web address] Password [facility]  12/16/2015, 5:44 PM

## 2015-12-16 NOTE — ED Notes (Signed)
Pt. Returned to room.

## 2015-12-17 ENCOUNTER — Inpatient Hospital Stay (HOSPITAL_COMMUNITY): Payer: Medicare Other

## 2015-12-17 DIAGNOSIS — E785 Hyperlipidemia, unspecified: Secondary | ICD-10-CM

## 2015-12-17 DIAGNOSIS — R0602 Shortness of breath: Secondary | ICD-10-CM

## 2015-12-17 DIAGNOSIS — I63212 Cerebral infarction due to unspecified occlusion or stenosis of left vertebral arteries: Principal | ICD-10-CM

## 2015-12-17 DIAGNOSIS — I639 Cerebral infarction, unspecified: Secondary | ICD-10-CM

## 2015-12-17 LAB — ECHOCARDIOGRAM COMPLETE
HEIGHTINCHES: 74 in
Weight: 3360 oz

## 2015-12-17 LAB — LIPID PANEL
CHOLESTEROL: 208 mg/dL — AB (ref 0–200)
HDL: 33 mg/dL — ABNORMAL LOW (ref >40)
LDL Cholesterol: 156 mg/dL — ABNORMAL HIGH (ref 0–99)
TRIGLYCERIDES: 97 mg/dL (ref <150)
Total CHOL/HDL Ratio: 6.3 RATIO
VLDL: 19 mg/dL (ref 0–40)

## 2015-12-17 LAB — TROPONIN I: Troponin I: <0.03 ng/mL (ref <0.03)

## 2015-12-17 MED ORDER — PRAVASTATIN SODIUM 10 MG PO TABS: 20.0000 mg | ORAL_TABLET | Freq: Every day | ORAL | Status: DC

## 2015-12-17 MED ORDER — ASPIRIN EC 81 MG PO TBEC: 81.0000 mg | DELAYED_RELEASE_TABLET | Freq: Every day | ORAL | Status: DC

## 2015-12-17 MED ORDER — CLOPIDOGREL BISULFATE 75 MG PO TABS: 75.0000 mg | ORAL_TABLET | Freq: Every day | ORAL | 0 refills | Status: DC

## 2015-12-17 MED ORDER — CLOPIDOGREL BISULFATE 75 MG PO TABS: 75.0000 mg | ORAL_TABLET | Freq: Every day | ORAL | Status: DC

## 2015-12-17 MED ORDER — PRAVASTATIN SODIUM 20 MG PO TABS: 20.0000 mg | ORAL_TABLET | Freq: Every day | ORAL | 1 refills | Status: DC

## 2015-12-17 NOTE — Progress Notes (Signed)
PT Screen  Patient Details Name: [name] MRN: [id number] DOB: March 11, 1951   PT screen: Screened pt, no skilled PT needs. He reports and demonstrates he is performing at his baseline strength/mobility with grossly 5/5 LE strength. Pt able to ambulate over 267ft without assistance and no noted unsteadiness. He is safe to return home at discharge, assuming he is medically cleared to do so. Will sign off.       2:42 PM,12/17/15 Elly Modena PT, [facility] [phone]

## 2015-12-17 NOTE — Discharge Summary (Signed)
Physician Discharge Summary  [name] [id number] DOB: 1950/08/28 DOA: 12/16/2015  PCP: Glo Herring., MD  Admit date: 12/16/2015 Discharge date: 12/17/2015  Admitted From: home Disposition:  home  Recommendations for Outpatient Follow-up:  1. Follow up with PCP in 1-2 weeks   Home Health: Equipment/Devices:  Discharge Condition:stable CODE STATUS: full Diet recommendation: Heart Healthy   Brief/Interim Summary: This patient presented to the emergency room with complaints of dizziness. Symptoms are present for approximately one week prior to admission. He also had AN episode of possible syncope. He had seen his primary care physician who initially felt he may have vertigo due to inner ear infection and treated the patient with steroid injection and doxycycline. When his symptoms persisted, his primary care physician ordered an MRI/MRA of the head. This revealed findings concerning for acute/subacute infarction in the left cerebellum with severe stenosis/occlusion of the vertebral artery segment on the left. He was referred to the ER for evaluation. Upon arriving to the emergency room, CT angiogram neck was performed to further evaluate vertebral circulation. This did not reveal any evidence of artery dissection. Case was discussed with neurology who recommended admission to hospital for further stroke workup. Patient underwent echocardiogram that did not show any acute findings. Lipid panel indicated elevated LDL. He reports trying Lipitor in the past but discontinued this due to muscle aches. He is willing to try a different statin. He was started on pravastatin. Hemoglobin A1c was checked and is currently in process. He was seen by physical therapy and did not require any further physical therapy. Patient reports that he had taken full dose aspirin in the past, but discontinued this when he developed epistaxis. Case was discussed with Dr. Leonel Ramsay on call for neurology at [facility].  He did not feel that any acute intervention such as revascularization was indicated at this time. He recommended medical management with dual antiplatelet therapy for 21 days and subsequently switching to monotherapy. The patient was placed on a baby aspirin and Plavix for the next 21 days after which she will discontinue Plavix and continue aspirin. Patient is feeling quite well at this time. His blood pressure is reasonably controlled and he can follow-up with his primary care physician for further management.  Discharge Diagnoses:  Principal Problem:   Acute ischemic stroke [facility]) Active Problems:   Vertebral artery stenosis with cerebral infarction [facility])   CVA (cerebral infarction)   History of prostate cancer   Hyperlipidemia    Discharge Instructions  Discharge Instructions    Diet - low sodium heart healthy    Complete by:  As directed   Increase activity slowly    Complete by:  As directed       Medication List    STOP taking these medications   doxycycline 100 MG tablet Commonly known as:  VIBRA-TABS     TAKE these medications   acetaminophen 500 MG tablet Commonly known as:  TYLENOL Take 1,000 mg by mouth daily as needed for mild pain, moderate pain or headache.   aspirin EC 81 MG tablet Take 1 tablet (81 mg total) by mouth daily.   clopidogrel 75 MG tablet Commonly known as:  PLAVIX Take 1 tablet (75 mg total) by mouth daily.   fish oil-omega-3 fatty acids 1000 MG capsule Take 1 g by mouth daily.   pravastatin 20 MG tablet Commonly known as:  PRAVACHOL Take 1 tablet (20 mg total) by mouth daily at 6 PM.       No Known  Allergies  Consultations:  Neurology, Dr. Leonel Ramsay (telephone)   Procedures/Studies: Dg Chest 2 View  Result Date: 12/16/2015 CLINICAL DATA:  Pressure around heart. EXAM: CHEST  2 VIEW COMPARISON:  11/07/2009 FINDINGS: Lungs are hyperexpanded. The lungs are clear wiithout focal pneumonia, edema, pneumothorax or pleural effusion.  Interstitial markings are diffusely coarsened with chronic features. The cardiopericardial silhouette is within normal limits for size. The visualized bony structures of the thorax are intact. Telemetry leads overlie the chest. IMPRESSION: Stable.  No acute cardiopulmonary findings. Electronically Signed   By: Misty Stanley M.D.   On: 12/16/2015 18:10   Ct Angio Neck W And/or Wo Contrast  Result Date: 12/16/2015 CLINICAL DATA:  Dizziness. Left cerebellar infarcts on MRI with occluded distal left vertebral artery. EXAM: CT ANGIOGRAPHY NECK TECHNIQUE: Multidetector CT imaging of the neck was performed using the standard protocol during bolus administration of intravenous contrast. Multiplanar CT image reconstructions and MIPs were obtained to evaluate the vascular anatomy. Carotid stenosis measurements (when applicable) are obtained utilizing NASCET criteria, using the distal internal carotid diameter as the denominator. CONTRAST:  80 mL Isovue 370 COMPARISON:  Head MRI/ MRA earlier today FINDINGS: Aortic arch: 3 vessel aortic arch with mild atherosclerotic calcification. Widely patent arch vessel origins. Right carotid system: Patent without stenosis. Minimal atherosclerotic plaque at the carotid bifurcation. Left carotid system: Patent without stenosis. Minimal atherosclerotic plaque at the carotid bifurcation. Vertebral arteries: The vertebral arteries are widely patent in the neck with the left being mildly dominant. The left V3 segment is normal in caliber and widely patent without evidence of dissection. Lack of flow related enhancement in the distal left V3 segment on the earlier MRA may have been secondary to slow flow due to high-grade distal stenosis. Intracranially, there is a similar appearance of severe stenosis/ nearly occlusive thrombus of the left V4 segment just proximal to the PICA origin. High-grade stenosis/ nonocclusive thrombus in the proximal basilar artery near the vertebrobasilar junction  as seen on earlier MRA is incompletely imaged. Both PICA is appear patent. Skeleton: Mild cervical spondylosis. Other neck: No mass. Upper chest: Small calcified granuloma in the right lung apex. IMPRESSION: 1. Normal appearance of the left vertebral artery in the neck, including the V3 segment, without evidence of dissection. 2. 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 3. No cervical carotid artery stenosis. Electronically Signed   By: Logan Bores M.D.   On: 12/16/2015 16:45   Mr Jodene Nam Head Wo Contrast  Addendum Date: 12/16/2015   ADDENDUM REPORT: 12/16/2015 13:58 ADDENDUM: Study discussed by telephone with Dr. Redmond School on 12/16/2015 at 1338 hours. We discussed that I favor progressive atherosclerotic or thromboembolic disease as the etiology of the distal left vertebral artery occlusion, especially in light of the up to severe anterior circulation atherosclerosis. Neck CTA with contrast would be necessary to exclude left vertebral artery dissection with certainty. Electronically Signed   By: Genevie Ann M.D.   On: 12/16/2015 13:58   Result Date: 12/16/2015 CLINICAL DATA:  65 year old male with posterior head pain and dizziness for 1 week. No known injury. Nausea. Initial encounter. EXAM: MRI HEAD WITHOUT CONTRAST MRA HEAD WITHOUT CONTRAST TECHNIQUE: Multiplanar, multiecho pulse sequences of the brain and surrounding structures were obtained without intravenous contrast. Angiographic images of the head were obtained using MRA technique without contrast. COMPARISON:  None. FINDINGS: MRI HEAD FINDINGS 3 cm area of abnormal signal in the posterior left  cerebellum corresponding with moderately restricted diffusion, T2 and FLAIR hyperintensity. No associated hemorrhage. No associated mass effect. Several surrounding punctate areas of signal abnormality including abnormal diffusion suggesting other  regional lacunar type infarcts. Major intracranial vascular flow voids are preserved, although there is heterogeneity of the distal left vertebral artery suggesting atherosclerosis. See below. No right cerebellar or brainstem diffusion abnormality. No supra tentorial diffusion restriction. No midline shift, mass effect, evidence of mass lesion, ventriculomegaly, extra-axial collection or acute intracranial hemorrhage. Cervicomedullary junction and pituitary are within normal limits. Negative visualized cervical spine. Scattered mild for age supratentorial nonspecific cerebral white matter T2 and FLAIR hyperintensity. No cortical encephalomalacia or chronic cerebral blood products identified. Visible internal auditory structures appear normal. Trace paranasal sinus mucosal thickening. Otherwise paranasal sinuses and mastoids are well pneumatized. Postoperative changes to the left globe. Otherwise negative orbits soft tissues. Negative scalp soft tissues. Normal bone marrow signal. MRA HEAD FINDINGS Preserved antegrade flow signal in the distal left V2 segment, but loss of flow signal in the left V3 and left V4 segments. Filling defect in the distal left vertebral artery near the left PICA origin, with reconstituted flow in the left PICA. Reconstituted flow in a short segment of the left V4 segment distal to the PICA, but high-grade stenosis and filling defect at the left vertebrobasilar junction. Superimposed incidental fenestration of the vertebrobasilar junction. Non dominant appearing distal right vertebral artery which is diminutive beyond the right PICA origin. Basilar artery is patent but diminutive and irregular. Mild to moderate stenosis at the mid basilar. SCA origins are normal. Fetal type bilateral PCA origins. Normal bilateral PCA branches. Antegrade flow in both ICA siphons. No siphon stenosis. Normal ophthalmic and posterior communicating artery origins. Patent carotid termini. Moderate to severe  stenosis at the left ACA origin. A1 segments, anterior communicating artery, and visualized ACA branches otherwise are within normal limits. Mild to moderate irregularity and stenosis of the right MCA M1 segment. The right MCA trifurcation is patent, with evidence of a 2 mm aneurysm directed superiorly from the trifurcation. Visualized right MCA branches are within normal limits. Normal left MCA origin but severe stenosis in the mid left MCA M1 segment, just proximal to the left anterior temporal artery origin. Mild M1 irregularity and stenosis distal to the left anterior temporal. Normal left MCA bifurcation. Negative visualized left MCA branches. IMPRESSION: 1. Acute to subacute infarcts in the left cerebellum. Mild cerebellar edema. No associated hemorrhage or mass effect. 2. Abnormal distal left vertebral artery is occluded or nearly occluded in the left V3 and V4 segments with filling defect at the left vertebrobasilar junction. Despite this there is reconstituted flow to the left PICA. This could be thromboembolic disease or less likely sequelae of vertebral artery dissection. Neck CTA with contrast might characterize further. 3. Severe stenosis which appears atherosclerotic in nature in the left MCA M1 segment and at the left ACA origin. Mild to moderate mid basilar artery and right MCA M1 stenosis. 4. Tiny right MCA trifurcation aneurysm, 2 mm. Electronically Signed: By: Genevie Ann M.D. On: 12/16/2015 13:30   Mr Brain Wo Contrast  Addendum Date: 12/16/2015   ADDENDUM REPORT: 12/16/2015 13:58 ADDENDUM: Study discussed by telephone with Dr. Redmond School on 12/16/2015 at 1338 hours. We discussed that I favor progressive atherosclerotic or thromboembolic disease as the etiology of the distal left vertebral artery occlusion, especially in light of the up to severe anterior circulation atherosclerosis. Neck CTA with contrast would be necessary to exclude left vertebral artery dissection  with certainty.  Electronically Signed   By: Genevie Ann M.D.   On: 12/16/2015 13:58   Result Date: 12/16/2015 CLINICAL DATA:  65 year old male with posterior head pain and dizziness for 1 week. No known injury. Nausea. Initial encounter. EXAM: MRI HEAD WITHOUT CONTRAST MRA HEAD WITHOUT CONTRAST TECHNIQUE: Multiplanar, multiecho pulse sequences of the brain and surrounding structures were obtained without intravenous contrast. Angiographic images of the head were obtained using MRA technique without contrast. COMPARISON:  None. FINDINGS: MRI HEAD FINDINGS 3 cm area of abnormal signal in the posterior left cerebellum corresponding with moderately restricted diffusion, T2 and FLAIR hyperintensity. No associated hemorrhage. No associated mass effect. Several surrounding punctate areas of signal abnormality including abnormal diffusion suggesting other regional lacunar type infarcts. Major intracranial vascular flow voids are preserved, although there is heterogeneity of the distal left vertebral artery suggesting atherosclerosis. See below. No right cerebellar or brainstem diffusion abnormality. No supra tentorial diffusion restriction. No midline shift, mass effect, evidence of mass lesion, ventriculomegaly, extra-axial collection or acute intracranial hemorrhage. Cervicomedullary junction and pituitary are within normal limits. Negative visualized cervical spine. Scattered mild for age supratentorial nonspecific cerebral white matter T2 and FLAIR hyperintensity. No cortical encephalomalacia or chronic cerebral blood products identified. Visible internal auditory structures appear normal. Trace paranasal sinus mucosal thickening. Otherwise paranasal sinuses and mastoids are well pneumatized. Postoperative changes to the left globe. Otherwise negative orbits soft tissues. Negative scalp soft tissues. Normal bone marrow signal. MRA HEAD FINDINGS Preserved antegrade flow signal in the distal left V2 segment, but loss of flow signal in the  left V3 and left V4 segments. Filling defect in the distal left vertebral artery near the left PICA origin, with reconstituted flow in the left PICA. Reconstituted flow in a short segment of the left V4 segment distal to the PICA, but high-grade stenosis and filling defect at the left vertebrobasilar junction. Superimposed incidental fenestration of the vertebrobasilar junction. Non dominant appearing distal right vertebral artery which is diminutive beyond the right PICA origin. Basilar artery is patent but diminutive and irregular. Mild to moderate stenosis at the mid basilar. SCA origins are normal. Fetal type bilateral PCA origins. Normal bilateral PCA branches. Antegrade flow in both ICA siphons. No siphon stenosis. Normal ophthalmic and posterior communicating artery origins. Patent carotid termini. Moderate to severe stenosis at the left ACA origin. A1 segments, anterior communicating artery, and visualized ACA branches otherwise are within normal limits. Mild to moderate irregularity and stenosis of the right MCA M1 segment. The right MCA trifurcation is patent, with evidence of a 2 mm aneurysm directed superiorly from the trifurcation. Visualized right MCA branches are within normal limits. Normal left MCA origin but severe stenosis in the mid left MCA M1 segment, just proximal to the left anterior temporal artery origin. Mild M1 irregularity and stenosis distal to the left anterior temporal. Normal left MCA bifurcation. Negative visualized left MCA branches. IMPRESSION: 1. Acute to subacute infarcts in the left cerebellum. Mild cerebellar edema. No associated hemorrhage or mass effect. 2. Abnormal distal left vertebral artery is occluded or nearly occluded in the left V3 and V4 segments with filling defect at the left vertebrobasilar junction. Despite this there is reconstituted flow to the left PICA. This could be thromboembolic disease or less likely sequelae of vertebral artery dissection. Neck CTA with  contrast might characterize further. 3. Severe stenosis which appears atherosclerotic in nature in the left MCA M1 segment and at the left ACA origin. Mild to moderate mid basilar artery and  right MCA M1 stenosis. 4. Tiny right MCA trifurcation aneurysm, 2 mm. Electronically Signed: By: Genevie Ann M.D. On: 12/16/2015 13:30    Echo: Left ventricle: The cavity size was normal. Wall thickness was   increased in a pattern of mild LVH. Systolic function was normal.   The estimated ejection fraction was in the range of 60% to 65%.   Wall motion was normal; there were no regional wall motion   abnormalities. Doppler parameters are consistent with abnormal   left ventricular relaxation (grade 1 diastolic dysfunction).   Doppler parameters are consistent with high ventricular filling   pressure. - Left atrium: The atrium was mildly dilated. - Right ventricle: The cavity size was mildly to moderately   dilated. Wall thickness was normal. Systolic function was normal.   Subjective: Patient denies any chest pain. Overall dizziness is getting better. Feels good. Wants to go home  Discharge Exam: Vitals:   12/17/15 0842 12/17/15 1449  BP: (!) 148/85 122/71  Pulse: 75 72  Resp: 18 18  Temp: 97.9 F (36.6 C) 98.4 F (36.9 C)   Vitals:   12/17/15 0457 12/17/15 0815 12/17/15 0842 12/17/15 1449  BP: (!) 141/87  (!) 148/85 122/71  Pulse: 71  75 72  Resp: 20  18 18   Temp: 97.9 F (36.6 C)  97.9 F (36.6 C) 98.4 F (36.9 C)  TempSrc: Oral  Oral Oral  SpO2: 96% 96% 97% 95%  Weight:      Height:        General: Pt is alert, awake, not in acute distress Cardiovascular: RRR, S1/S2 +, no rubs, no gallops Respiratory: CTA bilaterally, no wheezing, no rhonchi Abdominal: Soft, NT, ND, bowel sounds + Extremities: no edema, no cyanosis    The results of significant diagnostics from this hospitalization (including imaging, microbiology, ancillary and laboratory) are listed below for reference.      Microbiology: No results found for this or any previous visit (from the past 240 hour(s)).   Labs: BNP (last 3 results) No results for input(s): BNP in the last 8760 hours. Basic Metabolic Panel:  Recent Labs Lab 12/16/15 1448  NA 140  K 4.3  CL 101  GLUCOSE 90  BUN 18  CREATININE 0.80   Liver Function Tests: No results for input(s): AST, ALT, ALKPHOS, BILITOT, PROT, ALBUMIN in the last 168 hours. No results for input(s): LIPASE, AMYLASE in the last 168 hours. No results for input(s): AMMONIA in the last 168 hours. CBC:  Recent Labs Lab 12/16/15 1427 12/16/15 1448  WBC 9.9  --   NEUTROABS 7.1  --   HGB 16.5 17.0  HCT 48.2 50.0  MCV 90.3  --   PLT 265  --    Cardiac Enzymes:  Recent Labs Lab 12/16/15 1427 12/16/15 2328 12/17/15 0610  TROPONINI <0.03 <0.03 <0.03   BNP: Invalid input(s): POCBNP CBG: No results for input(s): GLUCAP in the last 168 hours. D-Dimer No results for input(s): DDIMER in the last 72 hours. Hgb A1c No results for input(s): HGBA1C in the last 72 hours. Lipid Profile  Recent Labs  12/17/15 0610  CHOL 208*  HDL 33*  LDLCALC 156*  TRIG 97  CHOLHDL 6.3   Thyroid function studies No results for input(s): TSH, T4TOTAL, T3FREE, THYROIDAB in the last 72 hours.  Invalid input(s): FREET3 Anemia work up No results for input(s): VITAMINB12, FOLATE, FERRITIN, TIBC, IRON, RETICCTPCT in the last 72 hours. Urinalysis    Component Value Date/Time   COLORURINE YELLOW 09/30/2015  [place] 09/30/2015 1006   LABSPEC 1.025 09/30/2015 1006   PHURINE 6.0 09/30/2015 1006   GLUCOSEU NEGATIVE 09/30/2015 1006   HGBUR SMALL (A) 09/30/2015 1006   BILIRUBINUR NEGATIVE 09/30/2015 1006   KETONESUR NEGATIVE 09/30/2015 1006   PROTEINUR NEGATIVE 09/30/2015 1006   UROBILINOGEN 0.2 11/06/2009 1940   NITRITE NEGATIVE 09/30/2015 1006   LEUKOCYTESUR NEGATIVE 09/30/2015 1006   Sepsis Labs Invalid input(s): PROCALCITONIN,  WBC,   LACTICIDVEN Microbiology No results found for this or any previous visit (from the past 240 hour(s)).   Time coordinating discharge: Over 30 minutes  SIGNED:   Kathie Dike, MD  [facility] 12/17/2015, 6:04 PM Pager   If 7PM-7AM, please contact night-coverage [email or web address] Password [email or web address]

## 2015-12-18 LAB — HEMOGLOBIN A1C
HEMOGLOBIN A1C: 5.2 % (ref 4.8–5.6)
MEAN PLASMA GLUCOSE: 103 mg/dL

## 2015-12-26 ENCOUNTER — Encounter (INDEPENDENT_AMBULATORY_CARE_PROVIDER_SITE_OTHER): Payer: Self-pay | Admitting: *Deleted

## 2016-03-08 ENCOUNTER — Emergency Department (HOSPITAL_COMMUNITY): Payer: Medicare Other

## 2016-03-08 ENCOUNTER — Emergency Department (HOSPITAL_COMMUNITY)
Admission: EM | Admit: 2016-03-08 | Discharge: 2016-03-08 | Disposition: A | Discharge status: Home / Self Care | Payer: Medicare Other | Attending: Emergency Medicine | Admitting: Emergency Medicine

## 2016-03-08 ENCOUNTER — Encounter (HOSPITAL_COMMUNITY): Payer: Self-pay | Admitting: Emergency Medicine

## 2016-03-08 DIAGNOSIS — Z79899 Other long term (current) drug therapy: Secondary | ICD-10-CM | POA: Diagnosis not present

## 2016-03-08 DIAGNOSIS — Z5181 Encounter for therapeutic drug level monitoring: Secondary | ICD-10-CM | POA: Diagnosis not present

## 2016-03-08 DIAGNOSIS — Z7982 Long term (current) use of aspirin: Secondary | ICD-10-CM | POA: Diagnosis not present

## 2016-03-08 DIAGNOSIS — R42 Dizziness and giddiness: Secondary | ICD-10-CM

## 2016-03-08 DIAGNOSIS — Z8546 Personal history of malignant neoplasm of prostate: Secondary | ICD-10-CM | POA: Diagnosis not present

## 2016-03-08 HISTORY — DX: Cerebral infarction, unspecified: I63.9

## 2016-03-08 LAB — COMPREHENSIVE METABOLIC PANEL
ALK PHOS: 62 U/L (ref 38–126)
ALT: 21 U/L (ref 17–63)
ANION GAP: 8 (ref 5–15)
AST: 19 U/L (ref 15–41)
Albumin: 4.3 g/dL (ref 3.5–5.0)
BILIRUBIN TOTAL: 0.4 mg/dL (ref 0.3–1.2)
BUN: 15 mg/dL (ref 6–20)
CALCIUM: 9.1 mg/dL (ref 8.9–10.3)
CO2: 22 mmol/L (ref 22–32)
Chloride: 106 mmol/L (ref 101–111)
Creatinine, Ser: 0.64 mg/dL (ref 0.61–1.24)
GLUCOSE: 100 mg/dL — AB (ref 65–99)
Potassium: 4.1 mmol/L (ref 3.5–5.1)
Sodium: 136 mmol/L (ref 135–145)
TOTAL PROTEIN: 6.8 g/dL (ref 6.5–8.1)

## 2016-03-08 LAB — DIFFERENTIAL
Basophils Absolute: 0 10*3/uL (ref 0.0–0.1)
Basophils Relative: 0 %
EOS PCT: 3 %
Eosinophils Absolute: 0.3 10*3/uL (ref 0.0–0.7)
LYMPHS ABS: 2.1 10*3/uL (ref 0.7–4.0)
LYMPHS PCT: 22 %
MONO ABS: 0.6 10*3/uL (ref 0.1–1.0)
MONOS PCT: 6 %
Neutro Abs: 6.5 10*3/uL (ref 1.7–7.7)
Neutrophils Relative %: 69 %

## 2016-03-08 LAB — CBC
HCT: 45.7 % (ref 39.0–52.0)
HEMOGLOBIN: 15.9 g/dL (ref 13.0–17.0)
MCH: 31.5 pg (ref 26.0–34.0)
MCHC: 34.8 g/dL (ref 30.0–36.0)
MCV: 90.7 fL (ref 78.0–100.0)
Platelets: 258 10*3/uL (ref 150–400)
RBC: 5.04 MIL/uL (ref 4.22–5.81)
RDW: 12.4 % (ref 11.5–15.5)
WBC: 9.6 10*3/uL (ref 4.0–10.5)

## 2016-03-08 LAB — I-STAT CHEM 8, ED
BUN: 16 mg/dL (ref 6–20)
CALCIUM ION: 1.14 mmol/L — AB (ref 1.15–1.40)
Chloride: 105 mmol/L (ref 101–111)
Creatinine, Ser: 0.7 mg/dL (ref 0.61–1.24)
Glucose, Bld: 99 mg/dL (ref 65–99)
HCT: 46 % (ref 39.0–52.0)
Hemoglobin: 15.6 g/dL (ref 13.0–17.0)
Potassium: 4.2 mmol/L (ref 3.5–5.1)
SODIUM: 138 mmol/L (ref 135–145)
TCO2: 24 mmol/L (ref 0–100)

## 2016-03-08 LAB — I-STAT TROPONIN, ED: Troponin i, poc: 0 ng/mL (ref 0.00–0.08)

## 2016-03-08 LAB — PROTIME-INR
INR: 0.93
Prothrombin Time: 12.5 seconds (ref 11.4–15.2)

## 2016-03-08 LAB — APTT: aPTT: 30 seconds (ref 24–36)

## 2016-03-08 MED ORDER — MECLIZINE HCL 25 MG PO TABS: 25.0000 mg | ORAL_TABLET | Freq: Three times a day (TID) | ORAL | 0 refills | Status: DC | PRN

## 2016-03-08 MED ORDER — PRAVASTATIN SODIUM 20 MG PO TABS: 20.0000 mg | ORAL_TABLET | Freq: Every day | ORAL | 0 refills | Status: DC

## 2016-03-08 MED ORDER — MECLIZINE HCL 12.5 MG PO TABS
50.0000 mg | ORAL_TABLET | Freq: Once | ORAL | Status: AC
  Administered 2016-03-08: 50 mg via ORAL
  Filled 2016-03-08: qty 4

## 2016-03-08 NOTE — ED Notes (Signed)
CT notified this RN that someone was currently being examined on the table

## 2016-03-08 NOTE — ED Notes (Signed)
Pt in MRI unable to do neuro checks and vitals

## 2016-03-08 NOTE — ED Notes (Signed)
CBG 101 

## 2016-03-08 NOTE — ED Notes (Addendum)
MD Thurnell Garbe at bedside, pt cleared for CT.    This nurse draw labs at this time.

## 2016-03-08 NOTE — ED Provider Notes (Signed)
[facility] DEPT Provider Note   CSN: [id number] Arrival date & time: 03/08/16  1307     History   Chief Complaint Chief Complaint  Patient presents with  . Dizziness    HPI [name] is a 65 y.o. male.  The history is provided by the patient and the EMS personnel. The history is limited by the condition of the patient (acuity of condition).  Dizziness    Pt was seen at 1310. Per EMS and pt report: Pt with sudden onset of "dizziness" that occurred today at noon. Pt describes the dizziness as "spinning" and "everything is moving." Pt endorses hx of CVA 2 months ago with similar presenting symptoms. Endorses compliance with ASA daily.  Denies CP/palpitations, no SOB/cough, no near syncope, no focal motor weakness, no tingling/numbness in extremities, no facial droop, no slurred speech.    Past Medical History:  Diagnosis Date  . Blood donor, platelets    does monthly -every 1st Monday - last 05-16-15  . Fatty tumor    Multiple over body  . Hematochezia   . Hemorrhoids   . Prostate cancer ([place]) 03/26/2005  . Stroke [facility])    left cerebellar    Patient Active Problem List   Diagnosis Date Noted  . Acute ischemic stroke ([place]) 12/16/2015  . Vertebral artery stenosis with cerebral infarction ([place]) 12/16/2015  . CVA (cerebral infarction) 12/16/2015  . History of prostate cancer 12/16/2015  . Hyperlipidemia 12/16/2015    Past Surgical History:  Procedure Laterality Date  . CATARACT EXTRACTION Left    one eye-left  . catract    . CHOLECYSTECTOMY  11/08/2009   laparoscopic  . COLONOSCOPY  09/24/05  . COLONOSCOPY  10/05/02  . COLONOSCOPY  12/08/2010   Procedure: COLONOSCOPY;  Surgeon: Rogene Houston, MD;  Location: [facility]ITE;  Service: Endoscopy;  Laterality: N/A;  1:05  . HERNIA REPAIR     Umbilical hernia repair.with Robotic laparaoscpic surgery for Prostate.  . INGUINAL HERNIA REPAIR Right 05/25/2015   Procedure: LAPAROSCOPIC,  RIGHT INGUINAL HERNIA WITH  MESH;  Surgeon: Ralene Ok, MD;  Location: [facility] ORS;  Service: General;  Laterality: Right;  . INSERTION OF MESH Right 05/25/2015   Procedure: INSERTION OF MESH;  Surgeon: Ralene Ok, MD;  Location: [facility] ORS;  Service: General;  Laterality: Right;  . PROSTATE SURGERY  10 Years ago   Patient describes as this being done 10 years ago, Prostate Trimming  . ROBOT ASSISTED LAPAROSCOPIC RADICAL PROSTATECTOMY  2006  . TONSILLECTOMY     As a Child       Home Medications    Prior to Admission medications   Medication Sig Start Date End Date Taking? Authorizing Provider  acetaminophen (TYLENOL) 500 MG tablet Take 1,000 mg by mouth daily as needed for mild pain, moderate pain or headache.    Historical Provider, MD  aspirin EC 81 MG tablet Take 1 tablet (81 mg total) by mouth daily. 12/17/15   Kathie Dike, MD  clopidogrel (PLAVIX) 75 MG tablet Take 1 tablet (75 mg total) by mouth daily. 12/17/15   Kathie Dike, MD  fish oil-omega-3 fatty acids 1000 MG capsule Take 1 g by mouth daily.      Historical Provider, MD  pravastatin (PRAVACHOL) 20 MG tablet Take 1 tablet (20 mg total) by mouth daily at 6 PM. 12/17/15   Kathie Dike, MD    Family History Family History  Problem Relation Age of Onset  . Stroke Mother 42  . Healthy Sister  Social History Social History  Substance Use Topics  . Smoking status: Never Smoker  . Smokeless tobacco: Never Used  . Alcohol use Yes     Comment: Patient states that he may drink a six pack of beer a year.     Allergies   Patient has no known allergies.   Review of Systems Review of Systems  Unable to perform ROS: Acuity of condition  Neurological: Positive for dizziness.     Physical Exam Updated Vital Signs BP 125/99   Pulse 77   Temp 98 F (36.7 C)   Resp 14   Ht 6\' 2"  (1.88 m)   Wt 208 lb (94.3 kg)   SpO2 98%   BMI 26.71 kg/m   Patient Vitals for the past 24 hrs:  BP Temp Temp src Pulse Resp SpO2 Height Weight  03/08/16  1630 125/99 - - 77 - 98 % - -  03/08/16 1615 123/90 - - 74 - 94 % - -  03/08/16 1545 133/93 - - 82 - 97 % - -  03/08/16 1430 135/96 - - 80 14 96 % - -  03/08/16 1415 (!) 144/101 - - 79 17 96 % - -  03/08/16 1405 - 98 F (36.7 C) - - - - - -  03/08/16 1400 133/92 - - 79 18 95 % - -  03/08/16 1350 - - - - - - 6\' 2"  (1.88 m) 208 lb (94.3 kg)  03/08/16 1345 146/86 98 F (36.7 C) Oral 72 19 95 % - -  03/08/16 1330 142/89 - - 82 23 96 % - -     Physical Exam 1315; Physical examination:  Nursing notes reviewed; Vital signs and O2 SAT reviewed;  Constitutional: Well developed, Well nourished, Well hydrated, In no acute distress; Head:  Normocephalic, atraumatic; Eyes: EOMI, PERRL, No scleral icterus; ENMT: Mouth and pharynx normal, Mucous membranes moist; Neck: Supple, Full range of motion, No lymphadenopathy; Cardiovascular: Regular rate and rhythm, No gallop; Respiratory: Breath sounds clear & equal bilaterally, No wheezes.  Speaking full sentences with ease, Normal respiratory effort/excursion; Chest: Nontender, Movement normal; Abdomen: Soft, Nontender, Nondistended, Normal bowel sounds; Genitourinary: No CVA tenderness; Extremities: Pulses normal, No tenderness, No edema, No calf edema or asymmetry.; Neuro: Major CN grossly intact. Speech clear.  No facial droop. +bilateral horizontal end gaze nystagmus. Grips equal. Strength 5/5 equal bilat UE's and LE's.  DTR 2/4 equal bilat UE's and LE's.  No gross sensory deficits.  Normal cerebellar testing bilat UE's (finger-nose) and LE's (heel-shin)..; Skin: Color normal, Warm, Dry.   ED Treatments / Results  Labs (all labs ordered are listed, but only abnormal results are displayed)   EKG  EKG Interpretation  Date/Time:  Thursday March 08 2016 13:16:23 EST Ventricular Rate:  90 PR Interval:    QRS Duration: 96 QT Interval:  366 QTC Calculation: 448 R Axis:   90 Text Interpretation:  Sinus rhythm Inferior infarct, old When compared with ECG  of 12/16/2015 No significant change was found Confirmed by [facility]  MD, Nunzio Cory [phone]) on 03/08/2016 1:35:22 PM       Radiology   Procedures Procedures (including critical care time)  Medications Ordered in ED Medications - No data to display   Initial Impression / Assessment and Plan / ED Course  I have reviewed the triage vital signs and the nursing notes.  Pertinent labs & imaging results that were available during my care of the patient were reviewed by me and considered in my medical  decision making (see chart for details).  MDM Reviewed: previous chart, nursing note and vitals Reviewed previous: labs, ECG, MRI and CT scan Interpretation: labs, ECG, x-ray, CT scan and MRI    Results for orders placed or performed during the hospital encounter of 03/08/16  Protime-INR  Result Value Ref Range   Prothrombin Time 12.5 11.4 - 15.2 seconds   INR 0.93   APTT  Result Value Ref Range   aPTT 30 24 - 36 seconds  CBC  Result Value Ref Range   WBC 9.6 4.0 - 10.5 K/uL   RBC 5.04 4.22 - 5.81 MIL/uL   Hemoglobin 15.9 13.0 - 17.0 g/dL   HCT 45.7 39.0 - 52.0 %   MCV 90.7 78.0 - 100.0 fL   MCH 31.5 26.0 - 34.0 pg   MCHC 34.8 30.0 - 36.0 g/dL   RDW 12.4 11.5 - 15.5 %   Platelets 258 150 - 400 K/uL  Differential  Result Value Ref Range   Neutrophils Relative % 69 %   Neutro Abs 6.5 1.7 - 7.7 K/uL   Lymphocytes Relative 22 %   Lymphs Abs 2.1 0.7 - 4.0 K/uL   Monocytes Relative 6 %   Monocytes Absolute 0.6 0.1 - 1.0 K/uL   Eosinophils Relative 3 %   Eosinophils Absolute 0.3 0.0 - 0.7 K/uL   Basophils Relative 0 %   Basophils Absolute 0.0 0.0 - 0.1 K/uL  Comprehensive metabolic panel  Result Value Ref Range   Sodium 136 135 - 145 mmol/L   Potassium 4.1 3.5 - 5.1 mmol/L   Chloride 106 101 - 111 mmol/L   CO2 22 22 - 32 mmol/L   Glucose, Bld 100 (H) 65 - 99 mg/dL   BUN 15 6 - 20 mg/dL   Creatinine, Ser 0.64 0.61 - 1.24 mg/dL   Calcium 9.1 8.9 - 10.3 mg/dL   Total Protein  6.8 6.5 - 8.1 g/dL   Albumin 4.3 3.5 - 5.0 g/dL   AST 19 15 - 41 U/L   ALT 21 17 - 63 U/L   Alkaline Phosphatase 62 38 - 126 U/L   Total Bilirubin 0.4 0.3 - 1.2 mg/dL   GFR calc non Af Amer >60 >60 mL/min   GFR calc Af Amer >60 >60 mL/min   Anion gap 8 5 - 15  I-stat troponin, ED  Result Value Ref Range   Troponin i, poc 0.00 0.00 - 0.08 ng/mL   Comment 3          I-Stat Chem 8, ED  Result Value Ref Range   Sodium 138 135 - 145 mmol/L   Potassium 4.2 3.5 - 5.1 mmol/L   Chloride 105 101 - 111 mmol/L   BUN 16 6 - 20 mg/dL   Creatinine, Ser 0.70 0.61 - 1.24 mg/dL   Glucose, Bld 99 65 - 99 mg/dL   Calcium, Ion 1.14 (L) 1.15 - 1.40 mmol/L   TCO2 24 0 - 100 mmol/L   Hemoglobin 15.6 13.0 - 17.0 g/dL   HCT 46.0 39.0 - 52.0 %   Ct Head Code Stroke W/o Cm Result Date: 03/08/2016 CLINICAL DATA:  Code stroke. Dizziness today at 1200 hours ; similar symptoms with prior stroke. History of prostate cancer. EXAM: CT HEAD WITHOUT CONTRAST TECHNIQUE: Contiguous axial images were obtained from the base of the skull through the vertex without intravenous contrast. COMPARISON:  MRI head December 16, 2015 FINDINGS: BRAIN: The ventricles and sulci are normal for age. No intraparenchymal hemorrhage, mass effect  nor midline shift. Patchy supratentorial white matter hypodensities within normal range for patient's age, though non-specific are most compatible with chronic small vessel ischemic disease. No acute large vascular territory infarcts. Old small LEFT cerebellar infarct corresponding to prior diffusion abnormality. No abnormal extra-axial fluid collections. Basal cisterns are patent. VASCULAR: Mildly dense intracranial vascular structures, disproportionately dense LEFT MCA with insular dot sign. Mild calcific atherosclerosis of the carotid siphons. SKULL: No skull fracture. No significant scalp soft tissue swelling. SINUSES/ORBITS: The mastoid air-cells and included paranasal sinuses are well-aerated.  Status post bilateral ocular lens implants.The included ocular globes and orbital contents are non-suspicious. OTHER: None. ASPECTS [facility] Stroke Program Early CT Score) - Ganglionic level infarction (caudate, lentiform nuclei, internal capsule, insula, M1-M3 cortex): 7 - Supraganglionic infarction (M4-M6 cortex): 3 in axial 10 Total score (0-10 with 10 being normal): 10 IMPRESSION: 1. Dense vascular structures including disproportionately dense LEFT middle cerebral artery without CT evidence of acute large vascular territory infarct. Findings can be seen with hemoconcentration versus recent intravenous contrast. Old LEFT cerebellar infarct. 2. ASPECTS is 10. Critical Value/emergent results were called by telephone at the time of interpretation on 03/08/2016 at 1:34 pm to Dr. Francine Graven , who verbally acknowledged these results. Electronically Signed   By: Elon Alas M.D.   On: 03/08/2016 13:35    Mr Jodene Nam Neck Wo Contrast Result Date: 03/08/2016 CLINICAL DATA:  65 year old male with acute dizziness today. Code stroke. Conspicuous density of the left MCA on noncontrast head CT today. Left cerebellar infarct in September. Initial encounter. EXAM: MRA NECK WITHOUT CONTRAST TECHNIQUE: Angiographic images of the neck were obtained using MRA technique without intravenous contrast. Carotid stenosis measurements (when applicable) are obtained utilizing NASCET criteria, using the distal internal carotid diameter as the denominator. COMPARISON:  Neck CTA and intracranial MRA 12/16/2015. Brain MRI from today reported separately. FINDINGS: Preserved antegrade flow in both carotid and vertebral artery is from the thoracic inlet to the skullbase. Both carotid bifurcations have a normal time-of-flight appearance. Mild proximal CCA tortuosity. The left vertebral artery is mildly dominant as before. The bilateral cervical vertebral artery segments remain normal. Intracranial MRA findings are reported separately  today. IMPRESSION: Negative neck MRA, with a stable appearance to the prior CTA on 12/16/2015. Electronically Signed   By: Genevie Ann M.D.   On: 03/08/2016 15:44   Mr Brain Wo Contrast (neuro Protocol)  Result Date: 03/08/2016 CLINICAL DATA:  65 year old male with acute dizziness today. Code stroke. Conspicuous density of the left MCA on noncontrast head CT today. Left cerebellar infarct in September. Initial encounter. EXAM: MRI HEAD WITHOUT CONTRAST TECHNIQUE: Multiplanar, multiecho pulse sequences of the brain and surrounding structures were obtained without intravenous contrast. COMPARISON:  Head CT 1319 hours today. Brain MRI 12/26/2015. Neck CTA 12/16/2015. FINDINGS: Brain: No restricted diffusion to suggest acute infarction. No midline shift, mass effect, evidence of mass lesion, ventriculomegaly, extra-axial collection or acute intracranial hemorrhage. Cervicomedullary junction and pituitary are within normal limits. Fairly mild encephalomalacia in the posterior left cerebellum related to the infarct which was acute in September. No associated chronic blood products. Elsewhere stable and largely unremarkable for age gray and white matter signal throughout the brain. Vascular: Major intracranial vascular flow voids appear stable since September. Skull and upper cervical spine: Negative. Normal bone marrow signal. Sinuses/Orbits: Stable orbits, negative aside from postoperative changes to the left globe. Visualized paranasal sinuses and mastoids are stable and well pneumatized. Other: Grossly stable and negative visualized internal auditory structures. Negative scalp soft tissues.  IMPRESSION: 1.  No acute intracranial abnormality. 2. Very mild encephalomalacia as the remnant of the left cerebellar infarct in September. 3. Otherwise largely unremarkable for age noncontrast MRI appearance of the brain. Electronically Signed   By: Genevie Ann M.D.   On: 03/08/2016 15:41    Mr Virgel Paling (cerebral Arteries) Result  Date: 03/08/2016 CLINICAL DATA:  65 year old male with acute dizziness today.Code stroke. Conspicuous density of the left MCA on noncontrast head CT today. Left cerebellar infarct in September. Initial encounter. EXAM: MRA HEAD WITHOUT CONTRAST TECHNIQUE: Angiographic images of the Circle of Willis were obtained using MRA technique without intravenous contrast. COMPARISON:  CTA neck and intracranial MRA 12/16/2015. Brain MRI from today reported separately. FINDINGS: Artifactual loss of distal vertebral artery flow on the lowest images of this intracranial MRA, but improved flow signal in the left vertebral artery V4 segment compared to 12/16/2015. There remains irregularity and stenosis in the left V4 proximal to the left PICA origin. Improved patency of the left vertebral parent vessel at an just distal to the left PICA origin. There remains severe stenosis at the left vertebrobasilar junction (series 3, image 54 which is not significantly improved (compared is series 12 image 71 on the prior MRA). The distal right vertebral artery was non dominant but normal on the prior studies and I suspect artifactual signal loss in the right V4 segment today, especially in light of normal neck MRA findings today. That vessel does functionally terminates in the right PICA which remains patent. The basilar artery remains patent but is diminutive. Fetal type bilateral PCA origins. SCA origins remain patent. Bilateral PCA branches are stable. Stable antegrade flow in both ICA siphons. Ophthalmic and posterior communicating artery origins remain normal. As before, no significant ICA siphon stenosis. Carotid termini remain patent. Stable severe stenosis in the proximal left M1 and A1 segments. The distal left MCA M1 and left MCA branches remain patent and appear stable. Normal right ACA. Anterior communicating artery and visualized bilateral ACA branches remain normal. Right MCA M1 segment and right MCA trifurcation remain patent.  Visualized right MCA branches remain normal. Again there is mild ectasia at the right MCA trifurcation raising the possibility of a tiny 2 mm aneurysm which is unchanged. IMPRESSION: 1. 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 Otherwise improved flow signal in the distal left vertebral artery although significant irregularity in the proximal V3 segment remains. The left PICA remains patent. 2. I suspect the non-dominant distal right vertebral artery remains normal (with artifactual signal loss in that vessel on this study). The right vertebral functionally terminates in the right PICA which remains patent. 3. Stable anterior circulation with severe stenoses at the left ACA origin and left M1 segment. Electronically Signed   By: Genevie Ann M.D.   On: 03/08/2016 15:59    1350:  Given pt's hx of similar symptoms with previous CVA, Code Stroke called on pt's arrival. [facility] Neuro has evaluated pt: no TPA at this time, recommends repeat MRI head and MRA head and neck, if negative pt can be discharged without change in treatment, can give antivert for dizziness.   1635:  MRI without acute stroke. BP reading similar to previous 12/2015 admit VS; home BP monitoring d/w pt who verb understanding. T/C to [facility] Neuro Dr. Shon Hale, case discussed, including:  HPI, pertinent PM/SHx, VS/PE, dx testing, ED course and treatment:  Reviewed MRA head/neck, states both are similar to MRA/CT-A in Sept,  OK to d/c with symptomatic tx for vertigo. Dx and testing, as well as d/w Neuro MD, d/w pt.  Questions answered.  Verb understanding, agreeable to d/c home with outpt f/u. Pt states he has run out of his statin med; will refill.      Final Clinical Impressions(s) / ED Diagnoses   Final diagnoses:  None    New Prescriptions New Prescriptions   No medications on file     Francine Graven, DO 03/12/16 2019

## 2016-03-08 NOTE — ED Notes (Signed)
Code Stroke initiated by Dr Thurnell Garbe

## 2016-03-08 NOTE — ED Triage Notes (Signed)
Called out to [place] home (pt was driving and pulled over) taking his wife from dialysis and dizziness began about 12:00.  Had a CVA in Sept, states that this feels this same way.  Increased stressed since wife has been going to dialysis.  States it feels like he was sitting in a chair and being spun around and trying to get up at and walk.    CBG 102 BP 169/101 NSR 12 lead 0830 last oral intake

## 2016-03-08 NOTE — Discharge Instructions (Signed)
Take the prescriptions as directed. Take your usual prescriptions as previously directed.  Take your blood pressure only once per day, once or twice per week, either in the morning approximately 1 hour or in the evening before you go to bed.  Always sit quietly for at least 15 minutes before taking your blood pressure.  Keep a diary of your blood pressures to show your doctor at your follow up office visit.  Call your regular medical doctor tomorrow morning to schedule a follow up appointment in the next 2 days.  Return to the Emergency Department immediately sooner if worsening.

## 2016-04-04 ENCOUNTER — Other Ambulatory Visit: Payer: Self-pay | Admitting: Pharmacist

## 2016-04-04 NOTE — Patient Outreach (Signed)
Call [name] back to let him know that I spoke with [name] in Dr. Nolon Rod office and per [name], the refill for pravastatin will be sent over to his [place] today.   [name] expresses appreciation and states that he will go to pick this refill up tomorrow. Patient reports that he has no further medication questions/concerns at this time.  [name], PharmD, [place] [facility] [phone]

## 2016-04-04 NOTE — Patient Outreach (Signed)
Call patient's PCP to request that a new prescription for pravastatin be sent to the patient's [place]. Speak with Alessa in the office who reports that the office will fax over a 90 day supply to the pharmacy now.  Harlow Asa, PharmD, [facility] [phone]

## 2016-04-04 NOTE — Patient Outreach (Signed)
Outreach call to [name] regarding medication adherence to pravastatin. Called and spoke with patient. HIPAA identifiers verified and verbal consent received.  [name] reports that he has been taking his pravastatin as directed. Reports that he misses maybe a couple of doses per month. Discuss with patient strategies for helping with medication adherence such as keeping his medication in a place that he sees everyday and the timing of administration. Patient denies side effects or difficulty with the cost of the medication. Reports that he has recently been trying to get the medication refilled, but has been having difficulty with getting a refill sent to his pharmacy.  Let [name] know that I will call to follow up with his PCP about this refill now and then call him back.  Harlow Asa, PharmD, [place] [facility] [phone]

## 2018-10-09 ENCOUNTER — Emergency Department (HOSPITAL_COMMUNITY): Payer: Medicare Other

## 2018-10-09 ENCOUNTER — Other Ambulatory Visit: Payer: Self-pay

## 2018-10-09 ENCOUNTER — Emergency Department (HOSPITAL_COMMUNITY)
Admission: EM | Admit: 2018-10-09 | Discharge: 2018-10-09 | Disposition: A | Discharge status: Home / Self Care | Payer: Medicare Other | Attending: Emergency Medicine | Admitting: Emergency Medicine

## 2018-10-09 ENCOUNTER — Encounter (HOSPITAL_COMMUNITY): Payer: Self-pay

## 2018-10-09 DIAGNOSIS — Z7982 Long term (current) use of aspirin: Secondary | ICD-10-CM | POA: Insufficient documentation

## 2018-10-09 DIAGNOSIS — Z8546 Personal history of malignant neoplasm of prostate: Secondary | ICD-10-CM | POA: Diagnosis not present

## 2018-10-09 DIAGNOSIS — Z8673 Personal history of transient ischemic attack (TIA), and cerebral infarction without residual deficits: Secondary | ICD-10-CM | POA: Insufficient documentation

## 2018-10-09 DIAGNOSIS — Y999 Unspecified external cause status: Secondary | ICD-10-CM | POA: Insufficient documentation

## 2018-10-09 DIAGNOSIS — Z7901 Long term (current) use of anticoagulants: Secondary | ICD-10-CM | POA: Diagnosis not present

## 2018-10-09 DIAGNOSIS — Y939 Activity, unspecified: Secondary | ICD-10-CM | POA: Insufficient documentation

## 2018-10-09 DIAGNOSIS — Y929 Unspecified place or not applicable: Secondary | ICD-10-CM | POA: Diagnosis not present

## 2018-10-09 DIAGNOSIS — W458XXA Other foreign body or object entering through skin, initial encounter: Secondary | ICD-10-CM | POA: Diagnosis not present

## 2018-10-09 DIAGNOSIS — S81811A Laceration without foreign body, right lower leg, initial encounter: Secondary | ICD-10-CM | POA: Diagnosis not present

## 2018-10-09 DIAGNOSIS — Z23 Encounter for immunization: Secondary | ICD-10-CM | POA: Insufficient documentation

## 2018-10-09 DIAGNOSIS — S8991XA Unspecified injury of right lower leg, initial encounter: Secondary | ICD-10-CM | POA: Diagnosis present

## 2018-10-09 MED ORDER — LIDOCAINE HCL (PF) 1 % IJ SOLN
INTRAMUSCULAR | Status: AC
  Filled 2018-10-09: qty 10

## 2018-10-09 MED ORDER — LIDOCAINE HCL (PF) 1 % IJ SOLN
5.0000 mL | Freq: Once | INTRAMUSCULAR | Status: AC
  Administered 2018-10-09: 10:00:00 5 mL
  Filled 2018-10-09: qty 6

## 2018-10-09 MED ORDER — CEFDINIR 300 MG PO CAPS: 300.0000 mg | ORAL_CAPSULE | Freq: Two times a day (BID) | ORAL | 0 refills | Status: DC

## 2018-10-09 MED ORDER — TETANUS-DIPHTH-ACELL PERTUSSIS 5-2.5-18.5 LF-MCG/0.5 IM SUSP
0.5000 mL | Freq: Once | INTRAMUSCULAR | Status: AC
  Administered 2018-10-09: 0.5 mL via INTRAMUSCULAR
  Filled 2018-10-09: qty 0.5

## 2018-10-09 NOTE — ED Provider Notes (Signed)
LACERATION REPAIR RIGHT LOWER LEG  Ultrasound not available patient is a 68 year old male who presents to the emergency department with a laceration to the right lower leg.  The patient injured the leg while climbing into a deer stand.  I discussed with the patient the need for laceration repair.  I described the procedure to the patient in terms of which he understands and he gives permission for the procedure.  Patient identified by armband.  Procedural timeout taken.  The wound was painted with Betadine.  It was then infiltrated with 2% plain lidocaine.  The wound was then irrigated with a liter of sterile saline.  Using sterile technique 3 mattress sutures of 3-0 Vicryl were placed subcutaneously.  Following this the wound was repaired with 15 staples.  The wound measured 8.5cm.  Sterile dressing was applied.  The patient tolerated the procedure without problem.   Lily Kocher, PA-C 10/09/18 [id number]    Fredia Sorrow, MD 10/10/18 [phone]

## 2018-10-09 NOTE — Discharge Instructions (Addendum)
Please change the dressing to your wound daily.  Please have the staples removed in 10 days.  Please see your primary physician or return to the emergency department if any pus like drainage from the area, or excess pain, or signs of advancing infection.  Please use Omnicef 2 times daily with food for prevention of infection.

## 2018-10-09 NOTE — ED Notes (Signed)
Approximate 10 inch laceration to right lateral leg. Area cleaned. Bleeding controlled

## 2018-10-09 NOTE — ED Provider Notes (Addendum)
[facility] EMERGENCY DEPARTMENT Provider Note   CSN: [id number] Arrival date & time: 10/09/18  5573    History   Chief Complaint Chief Complaint  Patient presents with  . Laceration    HPI [name] is a 68 y.o. male.     Patient with laceration to his right lateral calf area occurred from an injury on a deer stand.  Patient thinks it was cut by a screw.  No other injuries.  Tetanus not up-to-date best patient can remember.  Patient is on the blood thinner Plavix.  Injury occurred just shortly prior to arrival.     Past Medical History:  Diagnosis Date  . Blood donor, platelets    does monthly -every 1st Monday - last 05-16-15  . Fatty tumor    Multiple over body  . Hematochezia   . Hemorrhoids   . Prostate cancer ([place]) 03/26/2005  . 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 Acute ischemic stroke ([place]) 12/16/2015  . Vertebral artery stenosis with cerebral infarction ([place]) 12/16/2015  . CVA (cerebral infarction) 12/16/2015  . History of prostate cancer 12/16/2015  . Hyperlipidemia 12/16/2015    Past Surgical History:  Procedure Laterality Date  . CATARACT EXTRACTION Left    one eye-left  . catract    . CHOLECYSTECTOMY  11/08/2009   laparoscopic  . CHOLECYSTECTOMY    . COLONOSCOPY  09/24/05  . COLONOSCOPY  10/05/02  . COLONOSCOPY  12/08/2010   Procedure: COLONOSCOPY;  Surgeon: Rogene Houston, MD;  [facility];  Service: Endoscopy;  Laterality: N/A;  1:05  . HERNIA REPAIR     Umbilical hernia repair.with Robotic laparaoscpic surgery for Prostate.  . INGUINAL HERNIA REPAIR Right 05/25/2015   Procedure: LAPAROSCOPIC,  RIGHT INGUINAL HERNIA WITH MESH;  Surgeon: Ralene Ok, MD;  Location: [facility] ORS;  Service: General;  Laterality: Right;  . INSERTION OF MESH Right 05/25/2015   Procedure: INSERTION OF MESH;  Surgeon: Ralene Ok, MD;  Location: [facility] ORS;  Service: General;  Laterality: Right;  .  PROSTATE SURGERY  10 Years ago   Patient describes as this being done 10 years ago, Prostate Trimming  . ROBOT ASSISTED LAPAROSCOPIC RADICAL PROSTATECTOMY  2006  . TONSILLECTOMY     As a Child        Home Medications    Prior to Admission medications   Medication Sig Start Date End Date Taking? Authorizing Provider  aspirin EC 81 MG tablet Take 1 tablet (81 mg total) by mouth daily. 12/17/15   Kathie Dike, MD  clopidogrel (PLAVIX) 75 MG tablet Take 1 tablet (75 mg total) by mouth daily. 12/17/15   Kathie Dike, MD  meclizine (ANTIVERT) 25 MG tablet Take 1 tablet (25 mg total) by mouth 3 (three) times daily as needed for dizziness. 03/08/16   Francine Graven, DO  polyvinyl alcohol (LIQUIFILM TEARS) 1.4 % ophthalmic solution Place 1 drop into both eyes as needed for dry eyes.    [provider]  pravastatin (PRAVACHOL) 20 MG tablet Take 1 tablet (20 mg total) by mouth daily at 6 PM. 12/17/15   Kathie Dike, MD    Family History Family History  Problem Relation Age of Onset  . Stroke Mother 19  . Healthy Sister     Social History Social History   Tobacco Use  . Smoking status: Never Smoker  . Smokeless tobacco: Never Used  Substance Use Topics  .  Alcohol use: No    Comment: Patient states that he may drink a six pack of beer a year.  . Drug use: No     Allergies   Patient has no known allergies.   Review of Systems Review of Systems  Constitutional: Negative for chills and fever.  HENT: Negative for rhinorrhea and sore throat.   Eyes: Negative for visual disturbance.  Respiratory: Negative for cough and shortness of breath.   Cardiovascular: Negative for chest pain and leg swelling.  Gastrointestinal: Negative for abdominal pain, diarrhea, nausea and vomiting.  Genitourinary: Negative for dysuria.  Musculoskeletal: Negative for back pain and neck pain.  Skin: Positive for wound. Negative for rash.  Neurological: Negative for dizziness,  light-headedness and headaches.  Hematological: Does not bruise/bleed easily.  Psychiatric/Behavioral: Negative for confusion.     Physical Exam Updated Vital Signs BP (!) 160/99 (BP Location: Right Arm)   Pulse 77   Temp 98.1 F (36.7 C)   Resp 16   Physical Exam Vitals signs and nursing note reviewed.  Constitutional:      Appearance: He is well-developed.  HENT:     Head: Normocephalic and atraumatic.  Eyes:     Extraocular Movements: Extraocular movements intact.     Conjunctiva/sclera: Conjunctivae normal.     Pupils: Pupils are equal, round, and reactive to light.  Neck:     Musculoskeletal: Neck supple.  Cardiovascular:     Rate and Rhythm: Normal rate and regular rhythm.     Heart sounds: No murmur.  Pulmonary:     Effort: Pulmonary effort is normal. No respiratory distress.     Breath sounds: Normal breath sounds.  Abdominal:     Palpations: Abdomen is soft.     Tenderness: There is no abdominal tenderness.  Musculoskeletal: Normal range of motion.        General: Signs of injury present.     Comments: Right leg lateral aspect with linear wound.  Through subcutaneous tissue.  Bleeding controlled.  Distally good cap refill good movement and ankle good movement of toes sensation intact.  The laceration measures 8.5 cm.  Wound will be explored at time of repair.  Skin:    General: Skin is warm and dry.     Capillary Refill: Capillary refill takes less than 2 seconds.  Neurological:     General: No focal deficit present.     Mental Status: He is alert and oriented to person, place, and time.     Cranial Nerves: No cranial nerve deficit.     Sensory: No sensory deficit.     Motor: No weakness.      ED Treatments / Results  Labs (all labs ordered are listed, but only abnormal results are displayed) Labs Reviewed - No data to display  EKG None  Radiology Dg Tibia/fibula Right  Result Date: 10/09/2018 CLINICAL DATA:  Fall from tree with right leg pain,  initial encounter EXAM: RIGHT TIBIA AND FIBULA - 2 VIEW COMPARISON:  None. FINDINGS: No acute fracture or dislocation is noted. Some air is noted in the subcutaneous tissues in the calf laterally consistent with the known history of laceration IMPRESSION: No acute bony abnormality noted. Soft tissue injury consistent with the history. Electronically Signed   By: Inez Catalina M.D.   On: 10/09/2018 08:39    Procedures Procedures (including critical care time)  Medications Ordered in ED Medications  lidocaine (PF) (XYLOCAINE) 1 % injection 5 mL (has no administration in time range)  Tdap (BOOSTRIX)  injection 0.5 mL (0.5 mLs Intramuscular Given 10/09/18 0753)     Initial Impression / Assessment and Plan / ED Course  I have reviewed the triage vital signs and the nursing notes.  Pertinent labs & imaging results that were available during my care of the patient were reviewed by me and considered in my medical decision making (see chart for details).        Medical screening examination/treatment/procedure(s) were conducted as a shared visit with non-physician practitioner(s) and myself.  I personally evaluated the patient during the encounter.   Wound repair done by physician assistant.  Pictures added by physician assistant.   Tetanus updated.  Rays showed no evidence of any foreign body or bony involvement.   Final Clinical Impressions(s) / ED Diagnoses   Final diagnoses:  Laceration of right lower extremity, initial encounter    ED Discharge Orders    None       Fredia Sorrow, MD 10/09/18 9379    Fredia Sorrow, MD 10/09/18 0240    Fredia Sorrow, MD 10/09/18 [phone]

## 2018-10-09 NOTE — ED Notes (Signed)
Wound cleaned and dry dressing applied

## 2018-10-09 NOTE — ED Triage Notes (Signed)
Pt reports that he was climbing in a deer stand this morning to look for turkeys and step broke. Large laceration to Right lateral leg caused by nail

## 2018-10-14 ENCOUNTER — Other Ambulatory Visit (HOSPITAL_COMMUNITY): Payer: Self-pay | Admitting: Physician Assistant

## 2018-10-14 ENCOUNTER — Ambulatory Visit (HOSPITAL_COMMUNITY)
Admission: RE | Admit: 2018-10-14 | Discharge: 2018-10-14 | Disposition: A | Discharge status: Home / Self Care | Payer: Medicare Other | Source: Ambulatory Visit | Attending: Physician Assistant | Admitting: Physician Assistant

## 2018-10-14 ENCOUNTER — Other Ambulatory Visit: Payer: Self-pay

## 2018-10-14 DIAGNOSIS — R600 Localized edema: Secondary | ICD-10-CM | POA: Diagnosis present

## 2018-10-14 DIAGNOSIS — M79661 Pain in right lower leg: Secondary | ICD-10-CM

## 2018-12-04 ENCOUNTER — Other Ambulatory Visit: Payer: Self-pay

## 2018-12-04 DIAGNOSIS — Z20822 Contact with and (suspected) exposure to covid-19: Secondary | ICD-10-CM

## 2018-12-05 LAB — NOVEL CORONAVIRUS, NAA: SARS-CoV-2, NAA: NOT DETECTED

## 2019-07-29 DIAGNOSIS — E7849 Other hyperlipidemia: Secondary | ICD-10-CM | POA: Diagnosis not present

## 2019-07-29 DIAGNOSIS — E663 Overweight: Secondary | ICD-10-CM | POA: Diagnosis not present

## 2019-07-29 DIAGNOSIS — Z1389 Encounter for screening for other disorder: Secondary | ICD-10-CM | POA: Diagnosis not present

## 2019-07-29 DIAGNOSIS — Z0000 Encounter for general adult medical examination without abnormal findings: Secondary | ICD-10-CM | POA: Diagnosis not present

## 2019-07-29 DIAGNOSIS — Z0001 Encounter for general adult medical examination with abnormal findings: Secondary | ICD-10-CM | POA: Diagnosis not present

## 2019-07-29 DIAGNOSIS — Z6826 Body mass index (BMI) 26.0-26.9, adult: Secondary | ICD-10-CM | POA: Diagnosis not present

## 2019-09-28 DIAGNOSIS — Z961 Presence of intraocular lens: Secondary | ICD-10-CM | POA: Diagnosis not present

## 2019-10-02 IMAGING — US RIGHT LOWER EXTREMITY VENOUS ULTRASOUND
1 series · 13 of 24 positions shown · non-contrast
Comparison: None.

CLINICAL DATA: 68-year-old male with right lower extremity pain and
edema



[Series 1: right lower extremity venous ultrasound · 0.07mm/px · 13 of 52 slices shown]
[im 1/52]
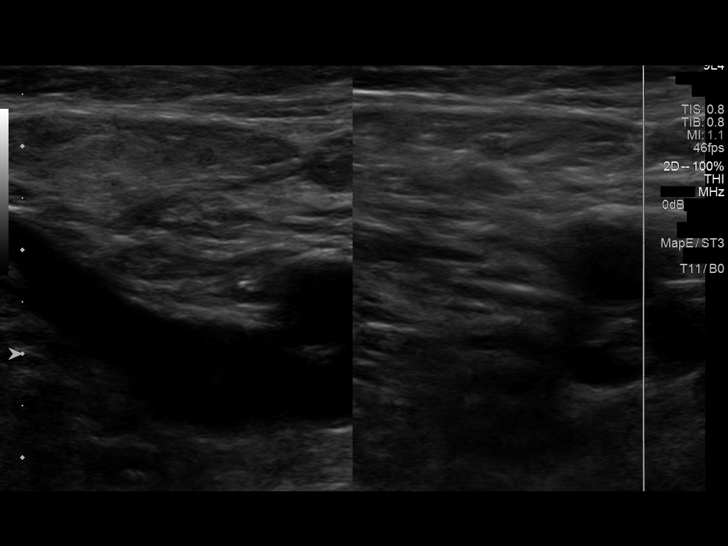
[im 5/52]
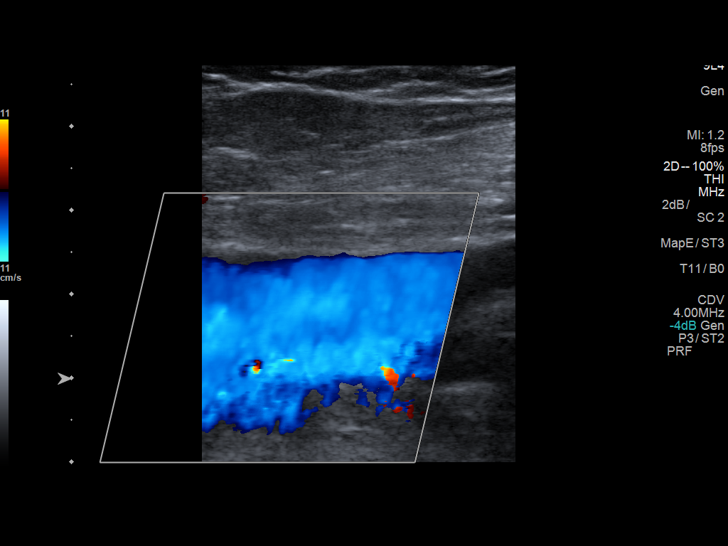
[im 9/52]
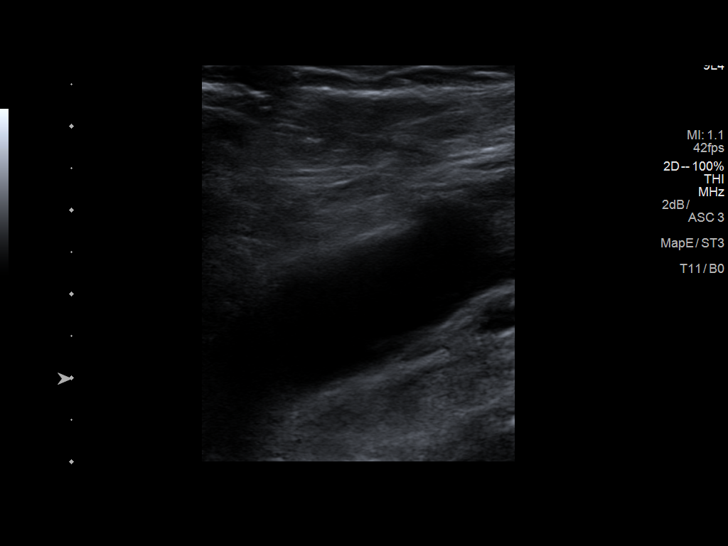
[im 14/52]
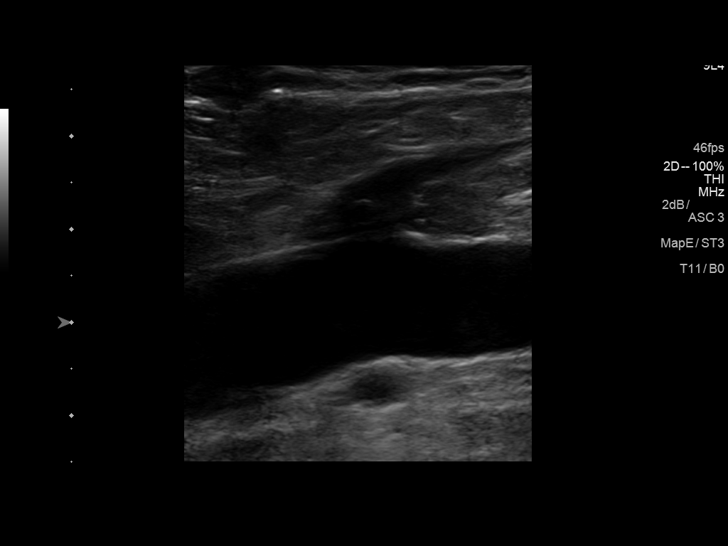
[im 18/52]
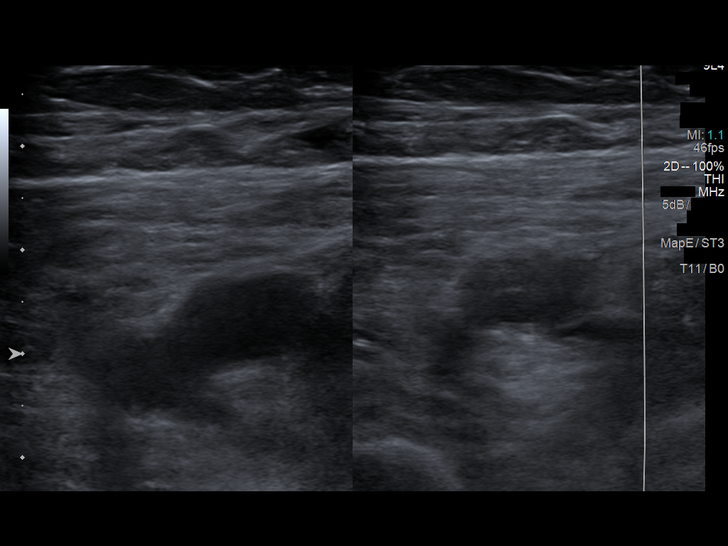
[im 23/52]
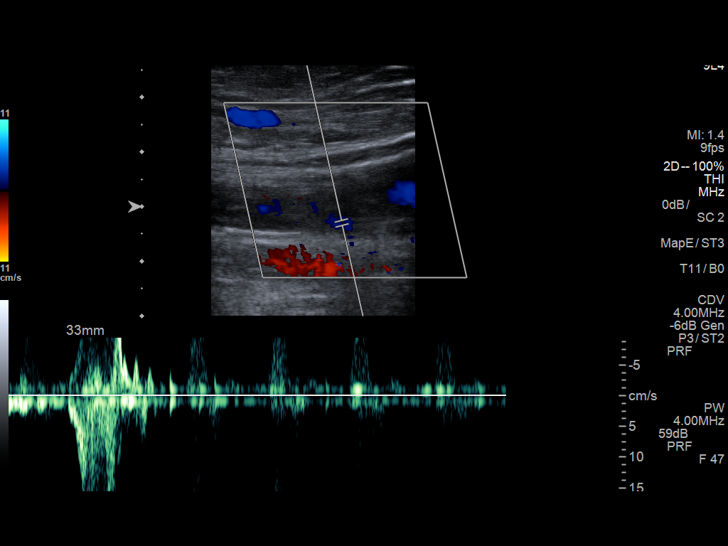
[im 27/52]
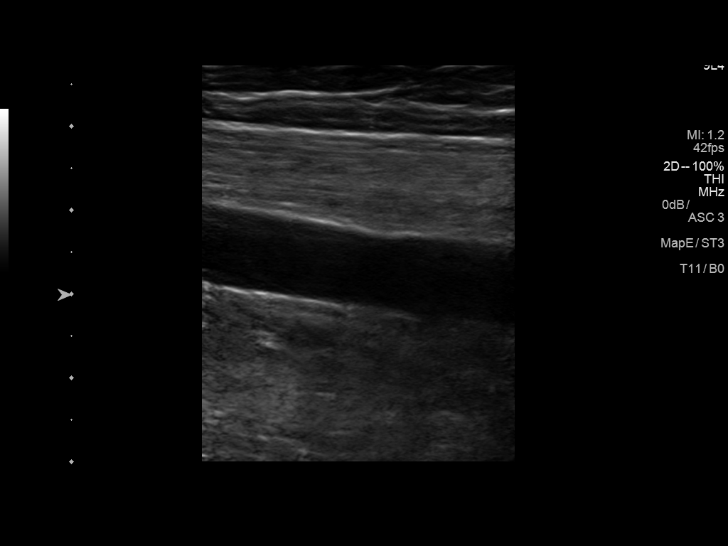
[im 29/52]
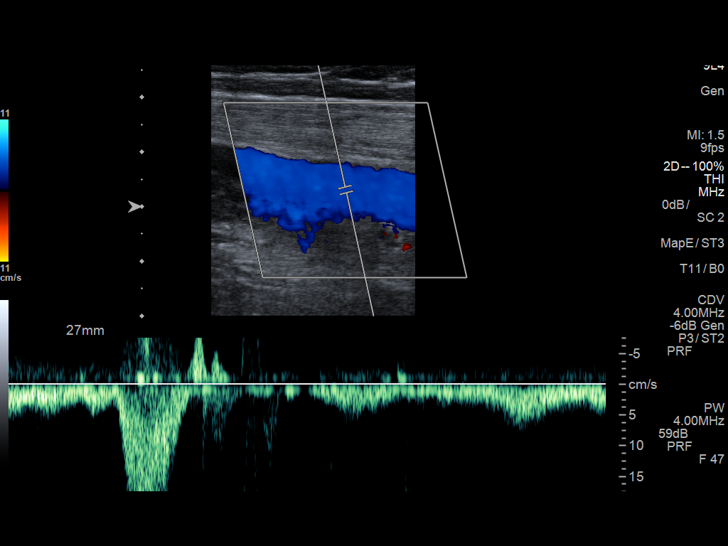
[im 34/52]
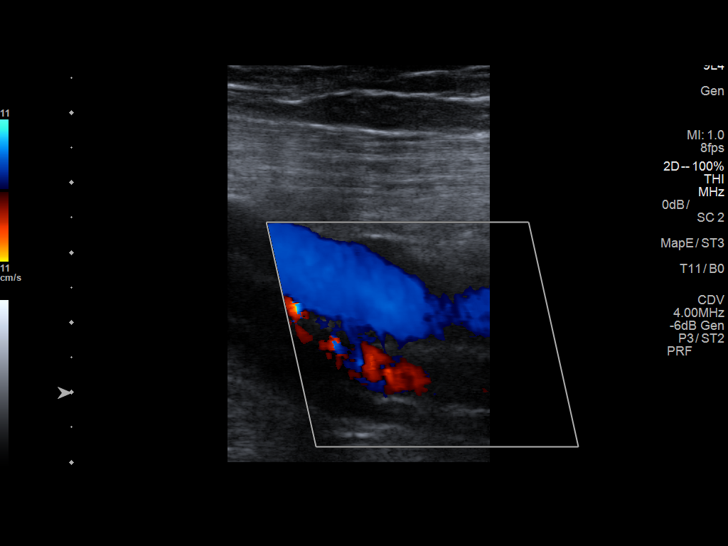
[im 38/52]
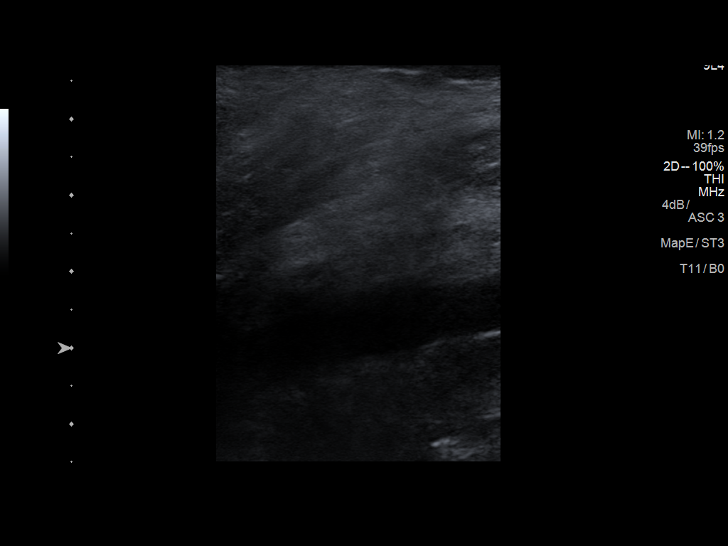
[im 43/52]
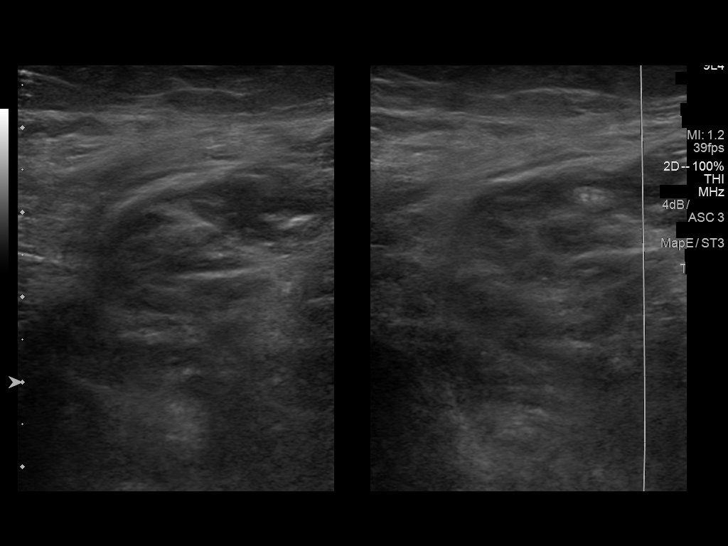
[im 47/52]
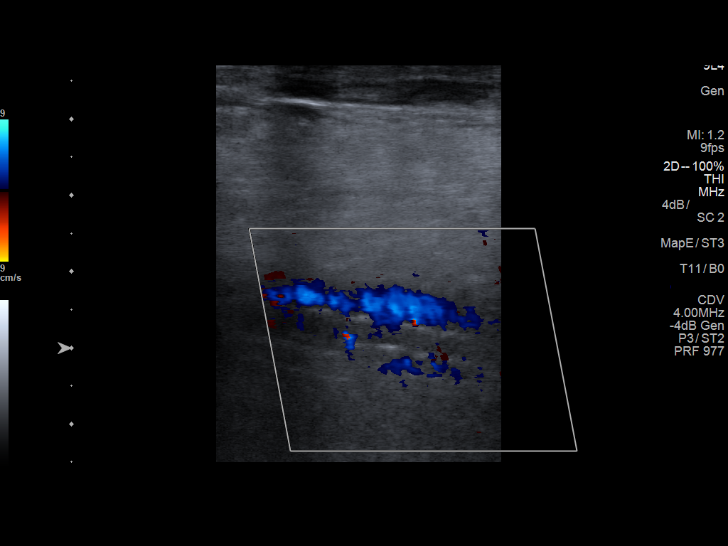
[im 52/52]
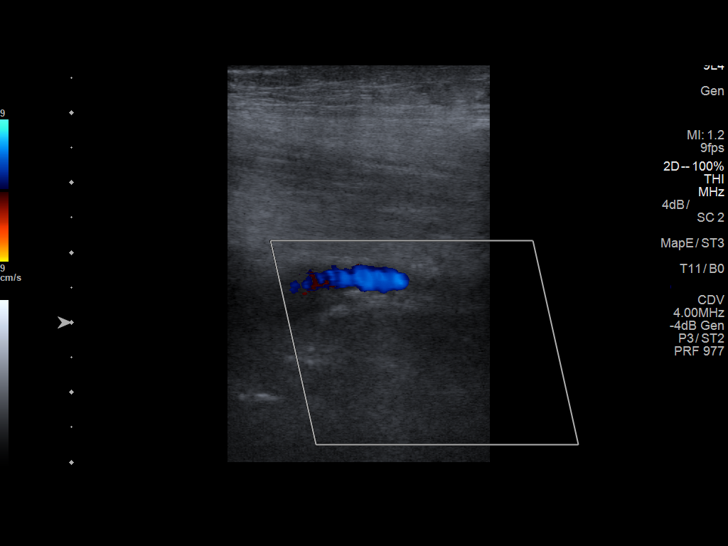

[13 of 24 positions shown; findings below may reference images not displayed]

FINDINGS: Contralateral Common Femoral Vein: Respiratory phasicity is normal
and symmetric with the symptomatic side. No evidence of thrombus.
Normal compressibility.

Common Femoral Vein: No evidence of thrombus. Normal
compressibility, respiratory phasicity and response to augmentation.

Saphenofemoral Junction: No evidence of thrombus. Normal
compressibility and flow on color Doppler imaging.

Profunda Femoral Vein: No evidence of thrombus. Normal
compressibility and flow on color Doppler imaging.

Femoral Vein: No evidence of thrombus. Normal compressibility,
respiratory phasicity and response to augmentation.

Popliteal Vein: No evidence of thrombus. Normal compressibility,
respiratory phasicity and response to augmentation.

Calf Veins: No evidence of thrombus. Normal compressibility and flow
on color Doppler imaging.

Superficial Great Saphenous Vein: No evidence of thrombus. Normal
compressibility.

Venous Reflux:  None.

Other Findings:  None.
IMPRESSION: No evidence of deep venous thrombosis.

## 2019-10-13 DIAGNOSIS — E663 Overweight: Secondary | ICD-10-CM | POA: Diagnosis not present

## 2019-10-13 DIAGNOSIS — B355 Tinea imbricata: Secondary | ICD-10-CM | POA: Diagnosis not present

## 2019-10-13 DIAGNOSIS — Z6825 Body mass index (BMI) 25.0-25.9, adult: Secondary | ICD-10-CM | POA: Diagnosis not present

## 2019-10-13 DIAGNOSIS — L299 Pruritus, unspecified: Secondary | ICD-10-CM | POA: Diagnosis not present

## 2019-11-12 DIAGNOSIS — Z6825 Body mass index (BMI) 25.0-25.9, adult: Secondary | ICD-10-CM | POA: Diagnosis not present

## 2019-11-12 DIAGNOSIS — R42 Dizziness and giddiness: Secondary | ICD-10-CM | POA: Diagnosis not present

## 2019-11-12 DIAGNOSIS — G4700 Insomnia, unspecified: Secondary | ICD-10-CM | POA: Diagnosis not present

## 2019-11-12 DIAGNOSIS — E663 Overweight: Secondary | ICD-10-CM | POA: Diagnosis not present

## 2020-08-30 DIAGNOSIS — R42 Dizziness and giddiness: Secondary | ICD-10-CM | POA: Diagnosis not present

## 2020-08-30 DIAGNOSIS — E7849 Other hyperlipidemia: Secondary | ICD-10-CM | POA: Diagnosis not present

## 2020-08-30 DIAGNOSIS — Z0001 Encounter for general adult medical examination with abnormal findings: Secondary | ICD-10-CM | POA: Diagnosis not present

## 2020-08-30 DIAGNOSIS — Z1389 Encounter for screening for other disorder: Secondary | ICD-10-CM | POA: Diagnosis not present

## 2020-08-30 DIAGNOSIS — Z1331 Encounter for screening for depression: Secondary | ICD-10-CM | POA: Diagnosis not present

## 2020-08-30 DIAGNOSIS — Z0000 Encounter for general adult medical examination without abnormal findings: Secondary | ICD-10-CM | POA: Diagnosis not present

## 2020-10-24 DIAGNOSIS — U071 COVID-19: Secondary | ICD-10-CM | POA: Diagnosis not present

## 2020-11-04 ENCOUNTER — Ambulatory Visit
Admission: RE | Admit: 2020-11-04 | Discharge: 2020-11-04 | Disposition: A | Discharge status: Home / Self Care | Payer: Medicare PPO | Source: Ambulatory Visit | Attending: Emergency Medicine | Admitting: Emergency Medicine

## 2020-11-04 ENCOUNTER — Other Ambulatory Visit: Payer: Self-pay

## 2020-11-04 VITALS — BP 112/70 | HR 96 | Temp 97.9°F | Resp 16

## 2020-11-04 DIAGNOSIS — R0981 Nasal congestion: Secondary | ICD-10-CM

## 2020-11-04 MED ORDER — AMOXICILLIN-POT CLAVULANATE 875-125 MG PO TABS: 1.0000 | ORAL_TABLET | Freq: Two times a day (BID) | ORAL | 0 refills | Status: AC

## 2020-11-04 NOTE — Discharge Instructions (Addendum)
Get plenty of rest and push fluids Augmentin prescribed.  Take as directed and to completion Use OTC zyrtec for nasal congestion, runny nose, and/or sore throat Use OTC flonase for nasal congestion and runny nose Use medications daily for symptom relief Use OTC medications like ibuprofen or tylenol as needed fever or pain Call or go to the ED if you have any new or worsening symptoms such as fever, worsening cough, shortness of breath, chest tightness, chest pain, turning blue, changes in mental status, etc..Marland Kitchen

## 2020-11-04 NOTE — ED Provider Notes (Signed)
[facility]   [id number] 11/04/20 Arrival Time: [id number]   CC: sinus congestion  SUBJECTIVE: History from: patient.  [name] is a 70 y.o. male who presents with sinus congestion, and cough x 10 days.  Dx'ed with covid 10 days ago.  Denies alleviating or aggravating factors.  Reports previous symptoms in the past.   Denies fever, chills, SOB, wheezing, chest pain, nausea, changes in bowel or bladder habits.    ROS: As per HPI.  All other pertinent ROS negative.     Past Medical History:  Diagnosis Date   Blood donor, platelets    does monthly -every 1st Monday - last 05-16-15   Fatty tumor    Multiple over body   Hematochezia    Hemorrhoids    Prostate cancer ([place]) 03/26/2005   Stroke ([place])    left cerebellar   Past Surgical History:  Procedure Laterality Date   CATARACT EXTRACTION Left    one eye-left   catract     CHOLECYSTECTOMY  11/08/2009   laparoscopic   CHOLECYSTECTOMY     COLONOSCOPY  09/24/05   COLONOSCOPY  10/05/02   COLONOSCOPY  12/08/2010   Procedure: COLONOSCOPY;  Surgeon: Rogene Houston, MD;  [facility];  Service: Endoscopy;  Laterality: N/A;  1:05   HERNIA REPAIR     Umbilical hernia repair.with Robotic laparaoscpic surgery for Prostate.   INGUINAL HERNIA REPAIR Right 05/25/2015   Procedure: LAPAROSCOPIC,  RIGHT INGUINAL HERNIA WITH MESH;  Surgeon: Ralene Ok, MD;  [facility];  Service: General;  Laterality: Right;   INSERTION OF MESH Right 05/25/2015   Procedure: INSERTION OF MESH;  Surgeon: Ralene Ok, MD;  Location[facility];  Service: General;  Laterality: Right;   PROSTATE SURGERY  10 Years ago   Patient describes as this being done 10 years ago, Prostate Trimming   ROBOT ASSISTED LAPAROSCOPIC RADICAL PROSTATECTOMY  2006   TONSILLECTOMY     As a Child   No Known Allergies No current facility-administered medications on file prior to encounter.   No current outpatient medications on file prior to encounter.    Social History   Socioeconomic History   Marital status: Married    Spouse name: Not on file   Number of children: Not on file   Years of education: Not on file   Highest education level: Not on file  Occupational History   Not on file  Tobacco Use   Smoking status: Never   Smokeless tobacco: Never  Substance and Sexual Activity   Alcohol use: No    Comment: Patient states that he may drink a six pack of beer a year.   Drug use: No   Sexual activity: Yes  Other Topics Concern   Not on file  Social History Narrative   Not on file   Social Determinants of Health   Financial Resource Strain: Not on file  Food Insecurity: Not on file  Transportation Needs: Not on file  Physical Activity: Not on file  Stress: Not on file  Social Connections: Not on file  Intimate Partner Violence: Not on file   Family History  Problem Relation Age of Onset   Stroke Mother 16   Healthy Sister     OBJECTIVE:  Vitals:   11/04/20 0935  BP: 112/70  Pulse: 96  Resp: 16  Temp: 97.9 F (36.6 C)  TempSrc: Oral  SpO2: 96%     General appearance: alert; appears mildly fatigued, but nontoxic; speaking in  full sentences and tolerating own secretions HEENT: NCAT; Ears: EACs clear, TMs pearly gray; Eyes: PERRL.  EOM grossly intact. Nose: nares patent without rhinorrhea, Throat: oropharynx clear, tonsils non erythematous or enlarged, uvula midline  Neck: supple without LAD Lungs: unlabored respirations, symmetrical air entry; cough: absent; no respiratory distress; CTAB Heart: regular rate and rhythm.   Skin: warm and dry Psychological: alert and cooperative; normal mood and affect  ASSESSMENT & PLAN:  1. Sinus congestion    Meds ordered this encounter  Medications   amoxicillin-clavulanate (AUGMENTIN) 875-125 MG tablet    Sig: Take 1 tablet by mouth every 12 (twelve) hours for 10 days.    Dispense:  20 tablet    Refill:  0    Order Specific Question:   Supervising Provider     Answer:   Raylene Everts [id number]   Get plenty of rest and push fluids Augmentin prescribed.  Take as directed and to completion Use OTC zyrtec for nasal congestion, runny nose, and/or sore throat Use OTC flonase for nasal congestion and runny nose Use medications daily for symptom relief Use OTC medications like ibuprofen or tylenol as needed fever or pain Call or go to the ED if you have any new or worsening symptoms such as fever, worsening cough, shortness of breath, chest tightness, chest pain, turning blue, changes in mental status, etc...   Reviewed expectations re: course of current medical issues. Questions answered. Outlined signs and symptoms indicating need for more acute intervention. Patient verbalized understanding. After Visit Summary given.          Stacey Drain [place], PA-C 11/04/20 [phone]

## 2020-11-04 NOTE — ED Triage Notes (Signed)
Diagnosed with covid last Monday.  States he still feels bad.  Chills, cough.

## 2021-02-14 DIAGNOSIS — Z6826 Body mass index (BMI) 26.0-26.9, adult: Secondary | ICD-10-CM | POA: Diagnosis not present

## 2021-02-14 DIAGNOSIS — I10 Essential (primary) hypertension: Secondary | ICD-10-CM | POA: Diagnosis not present

## 2021-02-14 DIAGNOSIS — E663 Overweight: Secondary | ICD-10-CM | POA: Diagnosis not present

## 2021-02-14 DIAGNOSIS — L570 Actinic keratosis: Secondary | ICD-10-CM | POA: Diagnosis not present

## 2021-10-20 DIAGNOSIS — Z6826 Body mass index (BMI) 26.0-26.9, adult: Secondary | ICD-10-CM | POA: Diagnosis not present

## 2021-10-20 DIAGNOSIS — Z1331 Encounter for screening for depression: Secondary | ICD-10-CM | POA: Diagnosis not present

## 2021-10-20 DIAGNOSIS — Z0001 Encounter for general adult medical examination with abnormal findings: Secondary | ICD-10-CM | POA: Diagnosis not present

## 2021-10-20 DIAGNOSIS — B356 Tinea cruris: Secondary | ICD-10-CM | POA: Diagnosis not present

## 2021-10-20 DIAGNOSIS — E559 Vitamin D deficiency, unspecified: Secondary | ICD-10-CM | POA: Diagnosis not present

## 2021-10-20 DIAGNOSIS — I10 Essential (primary) hypertension: Secondary | ICD-10-CM | POA: Diagnosis not present

## 2021-10-20 DIAGNOSIS — D518 Other vitamin B12 deficiency anemias: Secondary | ICD-10-CM | POA: Diagnosis not present

## 2021-10-20 DIAGNOSIS — E663 Overweight: Secondary | ICD-10-CM | POA: Diagnosis not present

## 2021-10-20 DIAGNOSIS — E039 Hypothyroidism, unspecified: Secondary | ICD-10-CM | POA: Diagnosis not present

## 2021-10-20 DIAGNOSIS — Z125 Encounter for screening for malignant neoplasm of prostate: Secondary | ICD-10-CM | POA: Diagnosis not present

## 2021-10-24 DIAGNOSIS — H35033 Hypertensive retinopathy, bilateral: Secondary | ICD-10-CM | POA: Diagnosis not present

## 2021-10-31 ENCOUNTER — Encounter (INDEPENDENT_AMBULATORY_CARE_PROVIDER_SITE_OTHER): Payer: Self-pay | Admitting: *Deleted

## 2021-12-01 ENCOUNTER — Other Ambulatory Visit (INDEPENDENT_AMBULATORY_CARE_PROVIDER_SITE_OTHER): Payer: Self-pay

## 2021-12-01 DIAGNOSIS — Z1211 Encounter for screening for malignant neoplasm of colon: Secondary | ICD-10-CM

## 2021-12-04 ENCOUNTER — Encounter (INDEPENDENT_AMBULATORY_CARE_PROVIDER_SITE_OTHER): Payer: Self-pay | Admitting: *Deleted

## 2021-12-19 DIAGNOSIS — Z6827 Body mass index (BMI) 27.0-27.9, adult: Secondary | ICD-10-CM | POA: Diagnosis not present

## 2021-12-19 DIAGNOSIS — I10 Essential (primary) hypertension: Secondary | ICD-10-CM | POA: Diagnosis not present

## 2021-12-19 DIAGNOSIS — R030 Elevated blood-pressure reading, without diagnosis of hypertension: Secondary | ICD-10-CM | POA: Diagnosis not present

## 2021-12-19 DIAGNOSIS — Z789 Other specified health status: Secondary | ICD-10-CM | POA: Diagnosis not present

## 2021-12-22 DIAGNOSIS — H26491 Other secondary cataract, right eye: Secondary | ICD-10-CM | POA: Diagnosis not present

## 2021-12-22 DIAGNOSIS — D23122 Other benign neoplasm of skin of left lower eyelid, including canthus: Secondary | ICD-10-CM | POA: Diagnosis not present

## 2021-12-22 DIAGNOSIS — H35033 Hypertensive retinopathy, bilateral: Secondary | ICD-10-CM | POA: Diagnosis not present

## 2021-12-22 DIAGNOSIS — H35373 Puckering of macula, bilateral: Secondary | ICD-10-CM | POA: Diagnosis not present

## 2021-12-25 ENCOUNTER — Ambulatory Visit
Admission: EM | Admit: 2021-12-25 | Discharge: 2021-12-25 | Disposition: A | Discharge status: Home / Self Care | Payer: Medicare PPO | Attending: Family Medicine | Admitting: Family Medicine

## 2021-12-25 DIAGNOSIS — T148XXA Other injury of unspecified body region, initial encounter: Secondary | ICD-10-CM

## 2021-12-25 DIAGNOSIS — S30827A Blister (nonthermal) of anus, initial encounter: Secondary | ICD-10-CM | POA: Diagnosis not present

## 2021-12-25 DIAGNOSIS — S30820A Blister (nonthermal) of lower back and pelvis, initial encounter: Secondary | ICD-10-CM

## 2021-12-25 DIAGNOSIS — S80221A Blister (nonthermal), right knee, initial encounter: Secondary | ICD-10-CM

## 2021-12-25 MED ORDER — CEPHALEXIN 500 MG PO CAPS: 500.0000 mg | ORAL_CAPSULE | Freq: Two times a day (BID) | ORAL | 0 refills | Status: AC

## 2021-12-25 NOTE — ED Triage Notes (Addendum)
Pt has round blister on inside of right knee and on his left side of buttocks. Area is red and stings. Reports noticing them on Saturday.   Reports taking bp and cholesterol medications but does not know the names of them.

## 2021-12-25 NOTE — ED Provider Notes (Addendum)
[facility]    CSN: [id number] Arrival date & time: 12/25/21  0911      History   Chief Complaint Chief Complaint  Patient presents with   Blister    HPI [name] is a 71 y.o. male.   Patient presenting today with a blister that popped up on his right medial knee about a week ago and now yesterday had a similar lesion pop up on his left gluteal fold.  The areas are red and painful, not draining and not raised.  Denies any injury to either area, fever, chills, bleeding or drainage, numbness, tingling.  No new hygiene products, medications or supplements that he is aware of.  Trying yeast cream to the areas with no relief.    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    Patient Active Problem List   Diagnosis Date Noted   Acute ischemic stroke ([place]) 12/16/2015   Vertebral artery stenosis with cerebral infarction ([place]) 12/16/2015   CVA (cerebral infarction) 12/16/2015   History of prostate cancer 12/16/2015   Hyperlipidemia 12/16/2015    Past Surgical History:  Procedure Laterality Date   CATARACT EXTRACTION Left    one eye-left   catract     CHOLECYSTECTOMY  11/08/2009   laparoscopic   CHOLECYSTECTOMY     COLONOSCOPY  09/24/05   COLONOSCOPY  10/05/02   COLONOSCOPY  12/08/2010   Procedure: COLONOSCOPY;  Surgeon: Rogene Houston, MD;  [facility];  Service: Endoscopy;  Laterality: N/A;  1:05   HERNIA REPAIR     Umbilical hernia repair.with Robotic laparaoscpic surgery for Prostate.   INGUINAL HERNIA REPAIR Right 05/25/2015   Procedure: LAPAROSCOPIC,  RIGHT INGUINAL HERNIA WITH MESH;  Surgeon: Ralene Ok, MD;  [facility];  Service: General;  Laterality: Right;   INSERTION OF MESH Right 05/25/2015   Procedure: INSERTION OF  MESH;  Surgeon: Ralene Ok, MD;  [facility];  Service: General;  Laterality: Right;   PROSTATE SURGERY  10 Years ago   Patient describes as this being done 10 years ago, Prostate Trimming   ROBOT ASSISTED LAPAROSCOPIC RADICAL PROSTATECTOMY  2006   TONSILLECTOMY     As a Child       Home Medications    Prior to Admission medications   Medication Sig Start Date End Date Taking? Authorizing Provider  cephALEXin (KEFLEX) 500 MG capsule Take 1 capsule (500 mg total) by mouth 2 (two) times daily. 12/25/21  Yes Volney American, PA-C  polyethylene glycol-electrolytes (TRILYTE) 420 g solution Take 4,000 mLs by mouth as directed. 12/27/21   Harvel Quale, MD    Family History Family History  Problem Relation Age of Onset   Stroke Mother 2   Healthy Sister     Social History Social History   Tobacco Use   Smoking status: Never   Smokeless tobacco: Never  Substance Use Topics   Alcohol use: No    Comment: Patient states that he may drink a six pack of beer a year.   Drug use: No     Allergies   Patient has no known allergies.   Review of Systems Review of Systems Per HPI  Physical Exam Triage Vital  Signs ED Triage Vitals  Enc Vitals Group     BP 12/25/21 0955 137/84     Pulse Rate 12/25/21 0955 76     Resp 12/25/21 0955 17     Temp 12/25/21 0955 97.9 F (36.6 C)     Temp src --      SpO2 12/25/21 0955 97 %     Weight --      Height --      Head Circumference --      Peak Flow --      Pain Score 12/25/21 0953 4     Pain Loc --      Pain Edu? --      Excl. in [place]? --    No data found.  Updated Vital Signs BP 137/84   Pulse 76   Temp 97.9 F (36.6 C)   Resp 17   SpO2 97%   Visual Acuity Right Eye Distance:   Left Eye Distance:   Bilateral Distance:    Right Eye Near:   Left Eye Near:    Bilateral Near:     Physical Exam Vitals and nursing note reviewed.  Constitutional:      Appearance: Normal appearance.  HENT:      Head: Atraumatic.  Eyes:     Extraocular Movements: Extraocular movements intact.     Conjunctiva/sclera: Conjunctivae normal.  Cardiovascular:     Rate and Rhythm: Normal rate and regular rhythm.  Pulmonary:     Effort: Pulmonary effort is normal.     Breath sounds: Normal breath sounds.  Musculoskeletal:        General: Normal range of motion.     Cervical back: Normal range of motion and neck supple.  Skin:    General: Skin is warm.     Comments: 1 cm circular shallow ulceration to right medial knee with surrounding erythema, mild edema.  No induration, fluctuance, active drainage.  Similar lesion to left gluteal fold  Neurological:     General: No focal deficit present.     Mental Status: He is oriented to person, place, and time.  Psychiatric:        Mood and Affect: Mood normal.        Thought Content: Thought content normal.        Judgment: Judgment normal.      UC Treatments / Results  Labs (all labs ordered are listed, but only abnormal results are displayed) Labs Reviewed - No data to display  EKG   Radiology No results found.  Procedures Procedures (including critical care time)  Medications Ordered in UC Medications - No data to display  Initial Impression / Assessment and Plan / UC Course  I have reviewed the triage vital signs and the nursing notes.  Pertinent labs & imaging results that were available during my care of the patient were reviewed by me and considered in my medical decision making (see chart for details).     Will cover with course of antibiotics in case area is becoming infected, discussed continued treatment with topical moisturizers and creams, keep areas covered for protection and follow-up with PCP for recheck. Final Clinical Impressions(s) / UC Diagnoses   Final diagnoses:  Blister  Blister (nonthermal), right knee, initial encounter  Blister of buttock, initial encounter     Discharge Instructions      You may apply  Neosporin to the area and keep the blisters covered with a patch bandage.  Take the full course of antibiotics  and if your symptoms are worsening any time follow-up right away.    ED Prescriptions     Medication Sig Dispense Auth. Provider   cephALEXin (KEFLEX) 500 MG capsule Take 1 capsule (500 mg total) by mouth 2 (two) times daily. 14 capsule Volney American, [place]      PDMP not reviewed this encounter.   Volney American, PA-C 12/25/21 [place], [place], [place] 01/04/22 [phone]

## 2021-12-25 NOTE — Discharge Instructions (Signed)
You may apply Neosporin to the area and keep the blisters covered with a patch bandage.  Take the full course of antibiotics and if your symptoms are worsening any time follow-up right away.

## 2021-12-27 ENCOUNTER — Encounter (INDEPENDENT_AMBULATORY_CARE_PROVIDER_SITE_OTHER): Payer: Self-pay

## 2021-12-27 ENCOUNTER — Telehealth (INDEPENDENT_AMBULATORY_CARE_PROVIDER_SITE_OTHER): Payer: Self-pay

## 2021-12-27 MED ORDER — PEG 3350-KCL-NA BICARB-NACL 420 G PO SOLR: 4000.0000 mL | ORAL | 0 refills | Status: DC

## 2021-12-27 NOTE — Telephone Encounter (Signed)
Daurice Ovando Ann Bena Kobel, CMA  ?

## 2021-12-27 NOTE — Telephone Encounter (Signed)
Referring MD/PCP: Dr Gerarda Fraction  Procedure: Tcs  Reason/Indication:  Screening   Has patient had this procedure before?  Yes   If so, when, by whom and where?  2013  Is there a family history of colon cancer?  No   Who?  What age when diagnosed?    Is patient diabetic? If yes, Type 1 or Type 2   No       Does patient have prosthetic heart valve or mechanical valve?  No  Do you have a pacemaker/defibrillator?  No   Has patient ever had endocarditis/atrial fibrillation? No   Does patient use oxygen? No   Has patient had joint replacement within last 12 months?  No   Is patient constipated or do they take laxatives? No   Does patient have a history of alcohol/drug use?  No   Have you had a stroke/heart attack last 6 mths? No   Do you take medicine for weight loss?  No   For male patients,: have you had a hysterectomy n/a                      are you post menopausal n/a                      do you still have your menstrual cycle n/a  Is patient on blood thinner such as Coumadin, Plavix and/or Aspirin? No   Medications: Lisinopril 5 mg daily, ezetimide 10 mg daily   Allergies: NKDA  Medication Adjustment per Dr Jenetta Downer None  Procedure date & time: 01/26/22 at 7:30 am

## 2022-01-12 DIAGNOSIS — B0229 Other postherpetic nervous system involvement: Secondary | ICD-10-CM | POA: Diagnosis not present

## 2022-01-12 DIAGNOSIS — Z6827 Body mass index (BMI) 27.0-27.9, adult: Secondary | ICD-10-CM | POA: Diagnosis not present

## 2022-01-26 ENCOUNTER — Ambulatory Visit (HOSPITAL_COMMUNITY)
Admission: RE | Admit: 2022-01-26 | Discharge: 2022-01-26 | Disposition: A | Discharge status: Home / Self Care | Payer: Medicare PPO | Source: Ambulatory Visit | Attending: Internal Medicine | Admitting: Internal Medicine

## 2022-01-26 ENCOUNTER — Other Ambulatory Visit: Payer: Self-pay

## 2022-01-26 ENCOUNTER — Encounter (HOSPITAL_COMMUNITY): Payer: Self-pay

## 2022-01-26 ENCOUNTER — Ambulatory Visit (HOSPITAL_COMMUNITY): Payer: Medicare PPO | Admitting: Anesthesiology

## 2022-01-26 ENCOUNTER — Ambulatory Visit (HOSPITAL_BASED_OUTPATIENT_CLINIC_OR_DEPARTMENT_OTHER): Payer: Medicare PPO | Admitting: Anesthesiology

## 2022-01-26 ENCOUNTER — Encounter (HOSPITAL_COMMUNITY)
Admission: RE | Disposition: A | Discharge status: Home / Self Care | Payer: Self-pay | Source: Ambulatory Visit | Attending: Internal Medicine

## 2022-01-26 ENCOUNTER — Ambulatory Visit (HOSPITAL_COMMUNITY): Admit: 2022-01-26 | Payer: Medicare PPO | Admitting: Gastroenterology

## 2022-01-26 DIAGNOSIS — K5730 Diverticulosis of large intestine without perforation or abscess without bleeding: Secondary | ICD-10-CM

## 2022-01-26 DIAGNOSIS — D123 Benign neoplasm of transverse colon: Secondary | ICD-10-CM | POA: Diagnosis not present

## 2022-01-26 DIAGNOSIS — Z139 Encounter for screening, unspecified: Secondary | ICD-10-CM | POA: Diagnosis not present

## 2022-01-26 DIAGNOSIS — Z8673 Personal history of transient ischemic attack (TIA), and cerebral infarction without residual deficits: Secondary | ICD-10-CM | POA: Diagnosis not present

## 2022-01-26 DIAGNOSIS — K635 Polyp of colon: Secondary | ICD-10-CM | POA: Diagnosis not present

## 2022-01-26 DIAGNOSIS — Z1211 Encounter for screening for malignant neoplasm of colon: Secondary | ICD-10-CM | POA: Diagnosis not present

## 2022-01-26 DIAGNOSIS — Z1212 Encounter for screening for malignant neoplasm of rectum: Secondary | ICD-10-CM

## 2022-01-26 DIAGNOSIS — K648 Other hemorrhoids: Secondary | ICD-10-CM | POA: Insufficient documentation

## 2022-01-26 HISTORY — PX: POLYPECTOMY: SHX5525

## 2022-01-26 HISTORY — PX: COLONOSCOPY WITH PROPOFOL: SHX5780

## 2022-01-26 HISTORY — DX: Essential (primary) hypertension: I10

## 2022-01-26 HISTORY — DX: Pure hypercholesterolemia, unspecified: E78.00

## 2022-01-26 SURGERY — COLONOSCOPY WITH PROPOFOL
Anesthesia: General

## 2022-01-26 SURGERY — COLONOSCOPY WITH PROPOFOL
Anesthesia: Monitor Anesthesia Care

## 2022-01-26 MED ORDER — LACTATED RINGERS IV SOLN: INTRAVENOUS | Status: DC

## 2022-01-26 MED ORDER — PROPOFOL 10 MG/ML IV BOLUS
INTRAVENOUS | Status: DC | PRN
  Administered 2022-01-26: 70 mg via INTRAVENOUS
  Administered 2022-01-26 (×2): 20 mg via INTRAVENOUS
  Administered 2022-01-26: 30 mg via INTRAVENOUS
  Administered 2022-01-26 (×2): 20 mg via INTRAVENOUS
  Administered 2022-01-26: 30 mg via INTRAVENOUS
  Administered 2022-01-26: 20 mg via INTRAVENOUS

## 2022-01-26 MED ORDER — LIDOCAINE HCL (CARDIAC) PF 100 MG/5ML IV SOSY
PREFILLED_SYRINGE | INTRAVENOUS | Status: DC | PRN
  Administered 2022-01-26: 50 mg via INTRAVENOUS

## 2022-01-26 NOTE — Anesthesia Preprocedure Evaluation (Signed)
Anesthesia Evaluation  Patient identified by MRN, date of birth, ID band Patient awake    Reviewed: Allergy & Precautions, H&P , NPO status , Patient's Chart, lab work & pertinent test results, reviewed documented beta blocker date and time   Airway Mallampati: II  TM Distance: >3 FB Neck ROM: full    Dental no notable dental hx.    Pulmonary neg pulmonary ROS,    Pulmonary exam normal breath sounds clear to auscultation       Cardiovascular Exercise Tolerance: Good negative cardio ROS   Rhythm:regular Rate:Normal     Neuro/Psych CVA, No Residual Symptoms negative psych ROS   GI/Hepatic negative GI ROS, Neg liver ROS,   Endo/Other  negative endocrine ROS  Renal/GU negative Renal ROS  negative genitourinary   Musculoskeletal   Abdominal   Peds  Hematology negative hematology ROS (+)   Anesthesia Other Findings   Reproductive/Obstetrics negative OB ROS                             Anesthesia Physical Anesthesia Plan  ASA: 3  Anesthesia Plan: General   Post-op Pain Management:    Induction:   PONV Risk Score and Plan: Propofol infusion  Airway Management Planned:   Additional Equipment:   Intra-op Plan:   Post-operative Plan:   Informed Consent: I have reviewed the patients History and Physical, chart, labs and discussed the procedure including the risks, benefits and alternatives for the proposed anesthesia with the patient or authorized representative who has indicated his/her understanding and acceptance.     Dental Advisory Given  Plan Discussed with: CRNA  Anesthesia Plan Comments:         Anesthesia Quick Evaluation

## 2022-01-26 NOTE — Transfer of Care (Signed)
Immediate Anesthesia Transfer of Care Note  Patient: [name]  Procedure(s) Performed: COLONOSCOPY WITH PROPOFOL POLYPECTOMY  Patient Location: PACU  Anesthesia Type:MAC  Level of Consciousness: drowsy and patient cooperative  Airway & Oxygen Therapy: Patient Spontanous Breathing  Post-op Assessment: Report given to RN and Post -op Vital signs reviewed and stable  Post vital signs: Reviewed and stable  Last Vitals:  Vitals Value Taken Time  BP 137/53 01/26/22 0922  Temp 36.4 C 01/26/22 0922  Pulse 83 01/26/22 0922  Resp 19 01/26/22 0922  SpO2 95 % 01/26/22 0922    Last Pain:  Vitals:   01/26/22 0922  TempSrc: Oral  PainSc:       Patients Stated Pain Goal: 6 (67/61/95 0932)  Complications: No notable events documented.

## 2022-01-26 NOTE — H&P (Signed)
Primary Care Physician:  Redmond School, MD Primary Gastroenterologist:  Dr. Abbey Chatters  Pre-Procedure History & Physical: HPI:  [name] is a 71 y.o. male is here for a colonoscopy for colon cancer screening purposes.  Patient denies any family history of colorectal cancer.  No melena or hematochezia.  No abdominal pain or unintentional weight loss.  No change in bowel habits.  Overall feels well from a GI standpoint.  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   INGUINAL HERNIA REPAIR Right 05/25/2015   Procedure: LAPAROSCOPIC,  RIGHT INGUINAL HERNIA WITH MESH;  Surgeon: Ralene Ok, MD;  Location: [facility];  Service: General;  Laterality: Right;   INSERTION OF MESH Right 05/25/2015   Procedure: INSERTION OF MESH;  Surgeon: Ralene Ok, MD;  Location: [facility];  Service: General;  Laterality: Right;   PROSTATE SURGERY  10 Years ago   Patient describes as this being done 10 years ago, Prostate Trimming   ROBOT ASSISTED LAPAROSCOPIC RADICAL PROSTATECTOMY  2006   TONSILLECTOMY     As a Child    Prior to Admission medications   Medication Sig Start Date End Date Taking? Authorizing Provider  cephALEXin (KEFLEX) 500 MG capsule Take 1 capsule (500 mg total) by mouth  2 (two) times daily. 12/25/21   Volney American, PA-C  polyethylene glycol-electrolytes (TRILYTE) 420 g solution Take 4,000 mLs by mouth as directed. 12/27/21   Harvel Quale, MD    Allergies as of 01/26/2022   (No Known Allergies)    Family History  Problem Relation Age of Onset   Stroke Mother 23   Healthy Sister     Social History   Socioeconomic History   Marital status: Married    Spouse name: Not on file   Number of children: Not on file   Years of education: Not on file   Highest education level: Not on file  Occupational History   Not on file  Tobacco Use   Smoking status: Never   Smokeless tobacco: Never  Vaping Use   Vaping Use: Never used  Substance and Sexual Activity   Alcohol use: No    Comment: Patient states that he may drink a six pack of beer a year.   Drug use: No   Sexual activity: Yes  Other Topics Concern   Not on file  Social History Narrative   Not on file   Social Determinants of Health   Financial Resource Strain: Not on file  Food Insecurity: Not on file  Transportation Needs: Not on file  Physical Activity: Not on file  Stress: Not on file  Social  Connections: Not on file  Intimate Partner Violence: Not on file    Review of Systems: See HPI, otherwise negative ROS  Physical Exam: Vital signs in last 24 hours: Temp:  [97.8 F (36.6 C)] 97.8 F (36.6 C) (10/20 0832) Pulse Rate:  [67] 67 (10/20 0832) Resp:  [20] 20 (10/20 0832) BP: (143)/(95) 143/95 (10/20 0832) SpO2:  [100 %] 100 % (10/20 0832) Weight:  [95.3 kg] 95.3 kg (10/20 0825)   General:   Alert,  Well-developed, well-nourished, pleasant and cooperative in NAD Head:  Normocephalic and atraumatic. Eyes:  Sclera clear, no icterus.   Conjunctiva pink. Ears:  Normal auditory acuity. Nose:  No deformity, discharge,  or lesions. Mouth:  No deformity or lesions, dentition normal. Neck:  Supple; no masses or thyromegaly. Lungs:  Clear throughout to  auscultation.   No wheezes, crackles, or rhonchi. No acute distress. Heart:  Regular rate and rhythm; no murmurs, clicks, rubs,  or gallops. Abdomen:  Soft, nontender and nondistended. No masses, hepatosplenomegaly or hernias noted. Normal bowel sounds, without guarding, and without rebound.   Msk:  Symmetrical without gross deformities. Normal posture. Extremities:  Without clubbing or edema. Neurologic:  Alert and  oriented x4;  grossly normal neurologically. Skin:  Intact without significant lesions or rashes. Cervical Nodes:  No significant cervical adenopathy. Psych:  Alert and cooperative. Normal mood and affect.  Impression/Plan: [name] is here for a colonoscopy to be performed for colon cancer screening purposes.  The risks of the procedure including infection, bleed, or perforation as well as benefits, limitations, alternatives and imponderables have been reviewed with the patient. Questions have been answered. All parties agreeable.

## 2022-01-26 NOTE — Op Note (Signed)
[facility] Patient Name: [name] Procedure Date: 01/26/2022 8:47 AM MRN: [id number] Date of Birth: October 21, 1950 Attending MD: Elon Alas. Abbey Chatters DO CSN: [id number] Age: 71 Admit Type: Outpatient Procedure:                Colonoscopy Indications:              Screening for colorectal malignant neoplasm Providers:                Elon Alas. Abbey Chatters, DO, Janeece Riggers, RN, Aram Candela Referring MD:              Medicines:                See the Anesthesia note for documentation of the                            administered medications Complications:            No immediate complications. Estimated Blood Loss:     Estimated blood loss was minimal. Procedure:                Pre-Anesthesia Assessment:                           - The anesthesia plan was to use monitored                            anesthesia care (MAC).                           After obtaining informed consent, the colonoscope                            was passed under direct vision. Throughout the                            procedure, the patient's blood pressure, pulse, and                            oxygen saturations were monitored continuously. The                            [id number]-HQ190L ([id number]) scope was introduced through                            the anus and advanced to the the cecum, identified                            by appendiceal orifice and ileocecal valve. The                            colonoscopy was performed without difficulty. The                            patient tolerated the procedure well. The quality  of the bowel preparation was evaluated using the                            BBPS [facility] Bowel Preparation Scale) with scores                            of: Right Colon = 3, Transverse Colon = 3 and Left                            Colon = 3 (entire mucosa seen well with no residual                            staining, small fragments of stool or opaque                             liquid). The total BBPS score equals 9. Scope In: 9:01:38 AM Scope Out: 9:16:52 AM Scope Withdrawal Time: 0 hours 12 minutes 29 seconds  Total Procedure Duration: 0 hours 15 minutes 14 seconds  Findings:      Internal hemorrhoids (one prolapsed) were found during retroflexion.      Multiple small and large-mouthed diverticula were found in the sigmoid       colon and descending colon.      Three sessile polyps were found in the descending colon and transverse       colon. The polyps were 4 to 8 mm in size. These polyps were removed with       a cold snare. Resection and retrieval were complete.      The exam was otherwise without abnormality. Impression:               - Internal hemorrhoids.                           - Diverticulosis in the sigmoid colon and in the                            descending colon.                           - Three 4 to 8 mm polyps in the descending colon                            and in the transverse colon, removed with a cold                            snare. Resected and retrieved.                           - The examination was otherwise normal. Moderate Sedation:      Per Anesthesia Care Recommendation:           - Patient has a contact number available for                            emergencies. The signs and symptoms of potential  delayed complications were discussed with the                            patient. Return to normal activities tomorrow.                            Written discharge instructions were provided to the                            patient.                           - Resume previous diet.                           - Continue present medications.                           - Await pathology results.                           - Repeat colonoscopy in 5 years for surveillance.                           - Return to GI clinic PRN. Procedure Code(s):        --- Professional ---                            [phone], Colonoscopy, flexible; with removal of                            tumor(s), polyp(s), or other lesion(s) by snare                            technique Diagnosis Code(s):        --- Professional ---                           Z12.11, Encounter for screening for malignant                            neoplasm of colon                           K64.8, Other hemorrhoids                           K63.5, Polyp of colon                           K57.30, Diverticulosis of large intestine without                            perforation or abscess without bleeding CPT copyright 2019 American Medical Association. All rights reserved. The codes documented in this report are preliminary and upon coder review may  be revised to meet current compliance requirements. Elon Alas. Abbey Chatters, DO Ethete Abbey Chatters, DO 01/26/2022  9:19:55 AM This report has been signed electronically. Number of Addenda: 0

## 2022-01-26 NOTE — Discharge Instructions (Signed)
  Colonoscopy Discharge Instructions  Read the instructions outlined below and refer to this sheet in the next few weeks. These discharge instructions provide you with general information on caring for yourself after you leave the hospital. Your doctor may also give you specific instructions. While your treatment has been planned according to the most current medical practices available, unavoidable complications occasionally occur.   ACTIVITY You may resume your regular activity, but move at a slower pace for the next 24 hours.  Take frequent rest periods for the next 24 hours.  Walking will help get rid of the air and reduce the bloated feeling in your belly (abdomen).  No driving for 24 hours (because of the medicine (anesthesia) used during the test).   Do not sign any important legal documents or operate any machinery for 24 hours (because of the anesthesia used during the test).  NUTRITION Drink plenty of fluids.  You may resume your normal diet as instructed by your doctor.  Begin with a light meal and progress to your normal diet. Heavy or fried foods are harder to digest and may make you feel sick to your stomach (nauseated).  Avoid alcoholic beverages for 24 hours or as instructed.  MEDICATIONS You may resume your normal medications unless your doctor tells you otherwise.  WHAT YOU CAN EXPECT TODAY Some feelings of bloating in the abdomen.  Passage of more gas than usual.  Spotting of blood in your stool or on the toilet paper.  IF YOU HAD POLYPS REMOVED DURING THE COLONOSCOPY: No aspirin products for 7 days or as instructed.  No alcohol for 7 days or as instructed.  Eat a soft diet for the next 24 hours.  FINDING OUT THE RESULTS OF YOUR TEST Not all test results are available during your visit. If your test results are not back during the visit, make an appointment with your caregiver to find out the results. Do not assume everything is normal if you have not heard from your  caregiver or the medical facility. It is important for you to follow up on all of your test results.  SEEK IMMEDIATE MEDICAL ATTENTION IF: You have more than a spotting of blood in your stool.  Your belly is swollen (abdominal distention).  You are nauseated or vomiting.  You have a temperature over 101.  You have abdominal pain or discomfort that is severe or gets worse throughout the day.   Your colonoscopy revealed 3 polyp(s) which I removed successfully. Await pathology results, my office will contact you. I recommend repeating colonoscopy in 5 years for surveillance purposes.   You also have diverticulosis and internal hemorrhoids. I would recommend increasing fiber in your diet or adding OTC Benefiber/Metamucil. Be sure to drink at least 4 to 6 glasses of water daily. Follow-up with GI as needed.   I hope you have a great rest of your week!  Nahiara Kretzschmar K. Keandre Linden, D.O. Gastroenterology and Hepatology [facility]  

## 2022-01-26 NOTE — Anesthesia Postprocedure Evaluation (Signed)
Anesthesia Post Note  Patient: [name]  Procedure(s) Performed: COLONOSCOPY WITH PROPOFOL POLYPECTOMY  Patient location during evaluation: Phase II Anesthesia Type: General Level of consciousness: awake Pain management: pain level controlled Vital Signs Assessment: post-procedure vital signs reviewed and stable Respiratory status: spontaneous breathing and respiratory function stable Cardiovascular status: blood pressure returned to baseline and stable Postop Assessment: no headache and no apparent nausea or vomiting Anesthetic complications: no Comments: Late entry   No notable events documented.   Last Vitals:  Vitals:   01/26/22 0922 01/26/22 0924  BP: (!) 137/53 (!) 137/53  Pulse: 83 77  Resp: 19 16  Temp: 36.4 C 36.4 C  SpO2: 95% 95%    Last Pain:  Vitals:   01/26/22 0924  TempSrc: Oral  PainSc: 0-No pain                 Louann Sjogren

## 2022-01-30 LAB — SURGICAL PATHOLOGY

## 2022-01-31 ENCOUNTER — Encounter (HOSPITAL_COMMUNITY): Payer: Self-pay | Admitting: Internal Medicine

## 2022-04-26 DIAGNOSIS — E663 Overweight: Secondary | ICD-10-CM | POA: Diagnosis not present

## 2022-04-26 DIAGNOSIS — I10 Essential (primary) hypertension: Secondary | ICD-10-CM | POA: Diagnosis not present

## 2022-04-26 DIAGNOSIS — Z6828 Body mass index (BMI) 28.0-28.9, adult: Secondary | ICD-10-CM | POA: Diagnosis not present

## 2022-05-23 DIAGNOSIS — R42 Dizziness and giddiness: Secondary | ICD-10-CM | POA: Diagnosis not present

## 2022-05-23 DIAGNOSIS — E119 Type 2 diabetes mellitus without complications: Secondary | ICD-10-CM | POA: Diagnosis not present

## 2022-05-23 DIAGNOSIS — E663 Overweight: Secondary | ICD-10-CM | POA: Diagnosis not present

## 2022-05-23 DIAGNOSIS — Z6828 Body mass index (BMI) 28.0-28.9, adult: Secondary | ICD-10-CM | POA: Diagnosis not present

## 2022-06-07 DIAGNOSIS — H35033 Hypertensive retinopathy, bilateral: Secondary | ICD-10-CM | POA: Diagnosis not present

## 2022-10-24 DIAGNOSIS — H35373 Puckering of macula, bilateral: Secondary | ICD-10-CM | POA: Diagnosis not present

## 2022-10-29 DIAGNOSIS — Z6827 Body mass index (BMI) 27.0-27.9, adult: Secondary | ICD-10-CM | POA: Diagnosis not present

## 2022-10-29 DIAGNOSIS — Z9229 Personal history of other drug therapy: Secondary | ICD-10-CM | POA: Diagnosis not present

## 2022-10-29 DIAGNOSIS — Z1331 Encounter for screening for depression: Secondary | ICD-10-CM | POA: Diagnosis not present

## 2022-10-29 DIAGNOSIS — I10 Essential (primary) hypertension: Secondary | ICD-10-CM | POA: Diagnosis not present

## 2022-10-29 DIAGNOSIS — Z0001 Encounter for general adult medical examination with abnormal findings: Secondary | ICD-10-CM | POA: Diagnosis not present

## 2022-10-29 DIAGNOSIS — E663 Overweight: Secondary | ICD-10-CM | POA: Diagnosis not present

## 2022-10-29 DIAGNOSIS — T466X5A Adverse effect of antihyperlipidemic and antiarteriosclerotic drugs, initial encounter: Secondary | ICD-10-CM | POA: Diagnosis not present

## 2022-12-11 ENCOUNTER — Ambulatory Visit
Admission: EM | Admit: 2022-12-11 | Discharge: 2022-12-11 | Disposition: A | Discharge status: Home / Self Care | Payer: Medicare PPO

## 2022-12-11 DIAGNOSIS — R519 Headache, unspecified: Secondary | ICD-10-CM

## 2022-12-11 DIAGNOSIS — R200 Anesthesia of skin: Secondary | ICD-10-CM | POA: Diagnosis not present

## 2022-12-11 DIAGNOSIS — G8929 Other chronic pain: Secondary | ICD-10-CM

## 2022-12-11 NOTE — Discharge Instructions (Addendum)
Your exam and vital signs are reassuring today, go to the emergency department for severely worsening symptoms at any time but otherwise follow-up with your primary care provider as soon as possible for further evaluation.  You may take Tylenol as needed for the headache

## 2022-12-11 NOTE — ED Provider Notes (Signed)
[facility]    CSN: [id number] Arrival date & time: 12/11/22  1138      History   Chief Complaint No chief complaint on file.   HPI [name] is a 72 y.o. male.   Patient with past medical history significant for history of prostate cancer, hyperlipidemia, CVA, hypertension presenting today for 61-month history of intermittent episodes of right posterior head pain and pressure, intermittent episodes of finger stinging pain and numbness on the right, and occasionally feeling off balance when walking.  Denies speech or swallowing changes, extremity weakness or loss of range of motion, mental status changes, chest pain, shortness of breath, palpitations.  No recent head injuries.  Not trying anything over-the-counter for symptoms.  Does have a PCP but has not yet spoken to them about this issue.  Currently feeling well and in his usual state of health without these symptoms.    Past Medical History:  Diagnosis Date   Blood donor, platelets    does monthly -every 1st Monday - last 05-16-15   Fatty tumor    Multiple over body   Hematochezia    Hemorrhoids    High blood pressure    High cholesterol    Prostate cancer ([facility]) 03/26/2005   Stroke [facility])    left cerebellar    Patient Active Problem List   Diagnosis Date Noted   Acute ischemic stroke ([facility]) 12/16/2015   Vertebral artery stenosis with cerebral infarction ([facility]) 12/16/2015   CVA (cerebral infarction) 12/16/2015   History of prostate cancer 12/16/2015   Hyperlipidemia 12/16/2015    Past Surgical History:  Procedure Laterality Date   CATARACT EXTRACTION Left    one eye-left   catract     CHOLECYSTECTOMY  11/08/2009   laparoscopic   CHOLECYSTECTOMY     COLONOSCOPY  09/24/05   COLONOSCOPY  10/05/02   COLONOSCOPY  12/08/2010   Procedure: COLONOSCOPY;  Surgeon: Malissa Hippo, MD;  [facility];  Service: Endoscopy;  Laterality: N/A;  1:05   COLONOSCOPY WITH PROPOFOL N/A 01/26/2022    Procedure: COLONOSCOPY WITH PROPOFOL;  Surgeon: Lanelle Bal, DO;  [facility];  Service: Endoscopy;  Laterality: N/A;   HERNIA REPAIR     Umbilical hernia repair.with Robotic laparaoscpic surgery for Prostate.   INGUINAL HERNIA REPAIR Right 05/25/2015   Procedure: LAPAROSCOPIC,  RIGHT INGUINAL HERNIA WITH MESH;  Surgeon: Axel Filler, MD;  Location[facility];  Service: General;  Laterality: Right;   INSERTION OF MESH Right 05/25/2015   Procedure: INSERTION OF MESH;  Surgeon: Axel Filler, MD;  Location: [facility];  Service: General;  Laterality: Right;   POLYPECTOMY  01/26/2022   Procedure: POLYPECTOMY;  Surgeon: Lanelle Bal, DO;  [facility];  Service: Endoscopy;;   PROSTATE SURGERY  10 Years ago   Patient describes as this being done 10 years ago, Prostate Trimming   ROBOT ASSISTED LAPAROSCOPIC RADICAL PROSTATECTOMY  2006   TONSILLECTOMY     As a Child       Home Medications    Prior to Admission medications   Medication Sig Start Date End Date Taking? Authorizing Provider  ezetimibe (ZETIA) 10 MG tablet Take 10 mg by mouth daily. 10/06/22  Yes [provider]  olmesartan (BENICAR) 40 MG tablet Take 40 mg by mouth daily. 11/26/22  Yes [provider]  cephALEXin (KEFLEX) 500 MG capsule Take 1 capsule (500 mg total) by mouth 2 (two) times daily. 12/25/21   Roosvelt Maser  Lanora Manis, PA-C    Family History Family History  Problem Relation Age of Onset   Stroke Mother 63   Healthy Sister     Social History Social History   Tobacco Use   Smoking status: Never   Smokeless tobacco: Never  Vaping Use   Vaping status: Never Used  Substance Use Topics   Alcohol use: No    Comment: Patient states that he may drink a six pack of beer a year.   Drug use: No     Allergies   Patient has no known allergies.   Review of Systems Review of Systems Per HPI  Physical Exam Triage Vital Signs ED Triage Vitals [12/11/22 1200]   Encounter Vitals Group     BP (!) 146/92     Systolic BP Percentile      Diastolic BP Percentile      Pulse Rate 81     Resp 13     Temp 97.9 F (36.6 C)     Temp Source Oral     SpO2 97 %     Weight      Height      Head Circumference      Peak Flow      Pain Score 0     Pain Loc      Pain Education      Exclude from Growth Chart    No data found.  Updated Vital Signs BP (!) 146/92 (BP Location: Right Arm)   Pulse 81   Temp 97.9 F (36.6 C) (Oral)   Resp 13   SpO2 97%   Visual Acuity Right Eye Distance:   Left Eye Distance:   Bilateral Distance:    Right Eye Near:   Left Eye Near:    Bilateral Near:     Physical Exam Vitals and nursing note reviewed.  Constitutional:      Appearance: Normal appearance.  HENT:     Head: Atraumatic.     Right Ear: Tympanic membrane normal.     Left Ear: Tympanic membrane normal.     Mouth/Throat:     Mouth: Mucous membranes are moist.     Pharynx: Oropharynx is clear.  Eyes:     Extraocular Movements: Extraocular movements intact.     Conjunctiva/sclera: Conjunctivae normal.     Pupils: Pupils are equal, round, and reactive to light.  Cardiovascular:     Rate and Rhythm: Normal rate and regular rhythm.  Pulmonary:     Effort: Pulmonary effort is normal.     Breath sounds: Normal breath sounds.  Musculoskeletal:        General: Normal range of motion.     Cervical back: Normal range of motion and neck supple.  Skin:    General: Skin is warm and dry.  Neurological:     General: No focal deficit present.     Mental Status: He is oriented to person, place, and time.     Cranial Nerves: No cranial nerve deficit.     Motor: No weakness.     Gait: Gait normal.  Psychiatric:        Mood and Affect: Mood normal.        Thought Content: Thought content normal.        Judgment: Judgment normal.      UC Treatments / Results  Labs (all labs ordered are listed, but only abnormal results are displayed) Labs Reviewed -  No data to display  EKG   Radiology No  results found.  Procedures Procedures (including critical care time)  Medications Ordered in UC Medications - No data to display  Initial Impression / Assessment and Plan / UC Course  I have reviewed the triage vital signs and the nursing notes.  Pertinent labs & imaging results that were available during my care of the patient were reviewed by me and considered in my medical decision making (see chart for details).     Vague constellation of symptoms, intermittent x 2 months.  Symptoms not currently present at the time of this exam.  Very minimally hypertensive in triage, otherwise vital signs within normal limits and exam benign with no focal neurologic deficits or other obvious concerning findings.  Offered EKG, labs and other workup available to [place] in this clinic but patient declines and plans to follow-up with primary care for a recheck.  Return for worsening symptoms or go to the emergency department.  Final Clinical Impressions(s) / UC Diagnoses   Final diagnoses:  Chronic nonintractable headache, unspecified headache type  Numbness of right hand     Discharge Instructions      Your exam and vital signs are reassuring today, go to the emergency department for severely worsening symptoms at any time but otherwise follow-up with your primary care provider as soon as possible for further evaluation.  You may take Tylenol as needed for the headache    ED Prescriptions   None    PDMP not reviewed this encounter.   Particia Nearing, [place] 12/11/22 1352

## 2022-12-11 NOTE — ED Triage Notes (Signed)
Pt c/o lightheadedness, pain in the back if the head on the right side, pain in the right hand near the fingers numbness and stinging, standing up to walk makes him feel like he's wobbling when walking. x 2 months

## 2022-12-12 ENCOUNTER — Other Ambulatory Visit (HOSPITAL_COMMUNITY): Payer: Self-pay | Admitting: Internal Medicine

## 2022-12-12 DIAGNOSIS — Z6826 Body mass index (BMI) 26.0-26.9, adult: Secondary | ICD-10-CM | POA: Diagnosis not present

## 2022-12-12 DIAGNOSIS — I10 Essential (primary) hypertension: Secondary | ICD-10-CM | POA: Diagnosis not present

## 2022-12-12 DIAGNOSIS — H539 Unspecified visual disturbance: Secondary | ICD-10-CM | POA: Diagnosis not present

## 2022-12-12 DIAGNOSIS — R519 Headache, unspecified: Secondary | ICD-10-CM

## 2022-12-12 DIAGNOSIS — R42 Dizziness and giddiness: Secondary | ICD-10-CM | POA: Diagnosis not present

## 2022-12-12 DIAGNOSIS — E663 Overweight: Secondary | ICD-10-CM | POA: Diagnosis not present

## 2022-12-29 ENCOUNTER — Ambulatory Visit (HOSPITAL_COMMUNITY)
Admission: RE | Admit: 2022-12-29 | Discharge: 2022-12-29 | Disposition: A | Discharge status: Home / Self Care | Payer: Medicare PPO | Source: Ambulatory Visit | Attending: Internal Medicine | Admitting: Internal Medicine

## 2022-12-29 DIAGNOSIS — I672 Cerebral atherosclerosis: Secondary | ICD-10-CM | POA: Diagnosis not present

## 2022-12-29 DIAGNOSIS — H539 Unspecified visual disturbance: Secondary | ICD-10-CM | POA: Diagnosis not present

## 2022-12-29 DIAGNOSIS — R42 Dizziness and giddiness: Secondary | ICD-10-CM | POA: Insufficient documentation

## 2022-12-29 DIAGNOSIS — R519 Headache, unspecified: Secondary | ICD-10-CM | POA: Insufficient documentation

## 2022-12-29 DIAGNOSIS — G8929 Other chronic pain: Secondary | ICD-10-CM | POA: Diagnosis not present

## 2023-02-19 DIAGNOSIS — R42 Dizziness and giddiness: Secondary | ICD-10-CM | POA: Diagnosis not present

## 2023-02-19 DIAGNOSIS — R32 Unspecified urinary incontinence: Secondary | ICD-10-CM | POA: Diagnosis not present

## 2023-02-19 DIAGNOSIS — M1990 Unspecified osteoarthritis, unspecified site: Secondary | ICD-10-CM | POA: Diagnosis not present

## 2023-02-19 DIAGNOSIS — Z8546 Personal history of malignant neoplasm of prostate: Secondary | ICD-10-CM | POA: Diagnosis not present

## 2023-02-19 DIAGNOSIS — E785 Hyperlipidemia, unspecified: Secondary | ICD-10-CM | POA: Diagnosis not present

## 2023-02-19 DIAGNOSIS — N181 Chronic kidney disease, stage 1: Secondary | ICD-10-CM | POA: Diagnosis not present

## 2023-02-19 DIAGNOSIS — I129 Hypertensive chronic kidney disease with stage 1 through stage 4 chronic kidney disease, or unspecified chronic kidney disease: Secondary | ICD-10-CM | POA: Diagnosis not present

## 2023-02-19 DIAGNOSIS — Z8673 Personal history of transient ischemic attack (TIA), and cerebral infarction without residual deficits: Secondary | ICD-10-CM | POA: Diagnosis not present

## 2023-05-31 DIAGNOSIS — E663 Overweight: Secondary | ICD-10-CM | POA: Diagnosis not present

## 2023-05-31 DIAGNOSIS — I10 Essential (primary) hypertension: Secondary | ICD-10-CM | POA: Diagnosis not present

## 2023-05-31 DIAGNOSIS — R1013 Epigastric pain: Secondary | ICD-10-CM | POA: Diagnosis not present

## 2023-05-31 DIAGNOSIS — E782 Mixed hyperlipidemia: Secondary | ICD-10-CM | POA: Diagnosis not present

## 2023-05-31 DIAGNOSIS — Z6828 Body mass index (BMI) 28.0-28.9, adult: Secondary | ICD-10-CM | POA: Diagnosis not present

## 2023-09-18 DIAGNOSIS — B356 Tinea cruris: Secondary | ICD-10-CM | POA: Diagnosis not present

## 2023-09-18 DIAGNOSIS — I10 Essential (primary) hypertension: Secondary | ICD-10-CM | POA: Diagnosis not present

## 2023-09-18 DIAGNOSIS — C4490 Unspecified malignant neoplasm of skin, unspecified: Secondary | ICD-10-CM | POA: Diagnosis not present

## 2023-09-18 DIAGNOSIS — Z6827 Body mass index (BMI) 27.0-27.9, adult: Secondary | ICD-10-CM | POA: Diagnosis not present

## 2023-10-21 DIAGNOSIS — X32XXXA Exposure to sunlight, initial encounter: Secondary | ICD-10-CM | POA: Diagnosis not present

## 2023-10-21 DIAGNOSIS — L570 Actinic keratosis: Secondary | ICD-10-CM | POA: Diagnosis not present

## 2023-10-21 DIAGNOSIS — C4441 Basal cell carcinoma of skin of scalp and neck: Secondary | ICD-10-CM | POA: Diagnosis not present

## 2023-10-21 DIAGNOSIS — D225 Melanocytic nevi of trunk: Secondary | ICD-10-CM | POA: Diagnosis not present

## 2023-10-21 DIAGNOSIS — Z1283 Encounter for screening for malignant neoplasm of skin: Secondary | ICD-10-CM | POA: Diagnosis not present

## 2023-10-31 DIAGNOSIS — D518 Other vitamin B12 deficiency anemias: Secondary | ICD-10-CM | POA: Diagnosis not present

## 2023-10-31 DIAGNOSIS — Z6827 Body mass index (BMI) 27.0-27.9, adult: Secondary | ICD-10-CM | POA: Diagnosis not present

## 2023-10-31 DIAGNOSIS — Z125 Encounter for screening for malignant neoplasm of prostate: Secondary | ICD-10-CM | POA: Diagnosis not present

## 2023-10-31 DIAGNOSIS — I10 Essential (primary) hypertension: Secondary | ICD-10-CM | POA: Diagnosis not present

## 2023-10-31 DIAGNOSIS — Z1331 Encounter for screening for depression: Secondary | ICD-10-CM | POA: Diagnosis not present

## 2023-10-31 DIAGNOSIS — B029 Zoster without complications: Secondary | ICD-10-CM | POA: Diagnosis not present

## 2023-10-31 DIAGNOSIS — Z0001 Encounter for general adult medical examination with abnormal findings: Secondary | ICD-10-CM | POA: Diagnosis not present

## 2023-10-31 DIAGNOSIS — E782 Mixed hyperlipidemia: Secondary | ICD-10-CM | POA: Diagnosis not present

## 2023-10-31 DIAGNOSIS — G9332 Myalgic encephalomyelitis/chronic fatigue syndrome: Secondary | ICD-10-CM | POA: Diagnosis not present

## 2023-10-31 DIAGNOSIS — E663 Overweight: Secondary | ICD-10-CM | POA: Diagnosis not present

## 2023-12-02 DIAGNOSIS — Z85828 Personal history of other malignant neoplasm of skin: Secondary | ICD-10-CM | POA: Diagnosis not present

## 2023-12-02 DIAGNOSIS — Z08 Encounter for follow-up examination after completed treatment for malignant neoplasm: Secondary | ICD-10-CM | POA: Diagnosis not present

## 2024-01-07 DIAGNOSIS — I10 Essential (primary) hypertension: Secondary | ICD-10-CM | POA: Diagnosis not present

## 2024-01-07 DIAGNOSIS — Z1159 Encounter for screening for other viral diseases: Secondary | ICD-10-CM | POA: Diagnosis not present

## 2024-01-07 DIAGNOSIS — I63219 Cerebral infarction due to unspecified occlusion or stenosis of unspecified vertebral arteries: Secondary | ICD-10-CM | POA: Diagnosis not present

## 2024-01-07 DIAGNOSIS — Z1211 Encounter for screening for malignant neoplasm of colon: Secondary | ICD-10-CM | POA: Diagnosis not present

## 2024-01-07 DIAGNOSIS — I69398 Other sequelae of cerebral infarction: Secondary | ICD-10-CM | POA: Diagnosis not present

## 2024-01-07 DIAGNOSIS — R7309 Other abnormal glucose: Secondary | ICD-10-CM | POA: Diagnosis not present

## 2024-01-07 DIAGNOSIS — R42 Dizziness and giddiness: Secondary | ICD-10-CM | POA: Diagnosis not present

## 2024-01-07 DIAGNOSIS — K648 Other hemorrhoids: Secondary | ICD-10-CM | POA: Diagnosis not present
